# Patient Record
Sex: Male | Born: 1945 | Race: White | Hispanic: No | Marital: Married | Smoking: Former smoker
Health system: Southern US, Community
[De-identification: ages and names within clinical notes are randomized; demographics above are authoritative.]

## PROBLEM LIST (undated history)

## (undated) DIAGNOSIS — R9431 Abnormal electrocardiogram [ECG] [EKG]: Secondary | ICD-10-CM

## (undated) DIAGNOSIS — G629 Polyneuropathy, unspecified: Secondary | ICD-10-CM

## (undated) DIAGNOSIS — I10 Essential (primary) hypertension: Secondary | ICD-10-CM

## (undated) DIAGNOSIS — Z8719 Personal history of other diseases of the digestive system: Secondary | ICD-10-CM

## (undated) DIAGNOSIS — J45909 Unspecified asthma, uncomplicated: Secondary | ICD-10-CM

## (undated) DIAGNOSIS — K219 Gastro-esophageal reflux disease without esophagitis: Secondary | ICD-10-CM

## (undated) DIAGNOSIS — Z8711 Personal history of peptic ulcer disease: Secondary | ICD-10-CM

## (undated) DIAGNOSIS — E7800 Pure hypercholesterolemia, unspecified: Secondary | ICD-10-CM

## (undated) DIAGNOSIS — K529 Noninfective gastroenteritis and colitis, unspecified: Secondary | ICD-10-CM

## (undated) DIAGNOSIS — Z87442 Personal history of urinary calculi: Secondary | ICD-10-CM

## (undated) HISTORY — DX: Noninfective gastroenteritis and colitis, unspecified: K52.9

## (undated) HISTORY — DX: Gastro-esophageal reflux disease without esophagitis: K21.9

## (undated) HISTORY — PX: ERCP: SHX60

## (undated) HISTORY — DX: Personal history of peptic ulcer disease: Z87.11

## (undated) HISTORY — DX: Pure hypercholesterolemia, unspecified: E78.00

## (undated) HISTORY — DX: Essential (primary) hypertension: I10

## (undated) HISTORY — DX: Unspecified asthma, uncomplicated: J45.909

---
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
## 2007-03-30 DIAGNOSIS — C61 Malignant neoplasm of prostate: Secondary | ICD-10-CM

## 2007-03-30 HISTORY — DX: Malignant neoplasm of prostate: C61

## 2007-03-30 HISTORY — PX: PROSTATE SURGERY: SHX751

## 2011-03-30 HISTORY — PX: OTHER SURGICAL HISTORY: SHX169

## 2012-03-29 HISTORY — PX: OTHER SURGICAL HISTORY: SHX169

## 2012-07-13 DIAGNOSIS — W3400XD Accidental discharge from unspecified firearms or gun, subsequent encounter: Secondary | ICD-10-CM | POA: Insufficient documentation

## 2012-07-13 DIAGNOSIS — I872 Venous insufficiency (chronic) (peripheral): Secondary | ICD-10-CM | POA: Insufficient documentation

## 2012-07-13 DIAGNOSIS — L97309 Non-pressure chronic ulcer of unspecified ankle with unspecified severity: Secondary | ICD-10-CM | POA: Insufficient documentation

## 2012-07-28 DIAGNOSIS — M19079 Primary osteoarthritis, unspecified ankle and foot: Secondary | ICD-10-CM | POA: Insufficient documentation

## 2012-11-02 DIAGNOSIS — E7800 Pure hypercholesterolemia, unspecified: Secondary | ICD-10-CM | POA: Insufficient documentation

## 2012-11-02 DIAGNOSIS — I10 Essential (primary) hypertension: Secondary | ICD-10-CM | POA: Diagnosis present

## 2013-03-29 DIAGNOSIS — A0471 Enterocolitis due to Clostridium difficile, recurrent: Secondary | ICD-10-CM | POA: Insufficient documentation

## 2014-09-23 DIAGNOSIS — D649 Anemia, unspecified: Secondary | ICD-10-CM | POA: Insufficient documentation

## 2014-09-23 DIAGNOSIS — Z7960 Long term (current) use of unspecified immunomodulators and immunosuppressants: Secondary | ICD-10-CM | POA: Insufficient documentation

## 2016-03-29 HISTORY — PX: OTHER SURGICAL HISTORY: SHX169

## 2016-03-29 HISTORY — PX: UPPER GI ENDOSCOPY: SHX6162

## 2016-05-31 DIAGNOSIS — R7989 Other specified abnormal findings of blood chemistry: Secondary | ICD-10-CM | POA: Insufficient documentation

## 2016-05-31 DIAGNOSIS — R932 Abnormal findings on diagnostic imaging of liver and biliary tract: Secondary | ICD-10-CM | POA: Insufficient documentation

## 2016-07-13 DIAGNOSIS — R7989 Other specified abnormal findings of blood chemistry: Secondary | ICD-10-CM | POA: Insufficient documentation

## 2016-07-13 DIAGNOSIS — K838 Other specified diseases of biliary tract: Secondary | ICD-10-CM | POA: Insufficient documentation

## 2017-01-24 DIAGNOSIS — R197 Diarrhea, unspecified: Secondary | ICD-10-CM | POA: Insufficient documentation

## 2017-01-24 DIAGNOSIS — K529 Noninfective gastroenteritis and colitis, unspecified: Secondary | ICD-10-CM | POA: Insufficient documentation

## 2017-03-29 HISTORY — PX: OTHER SURGICAL HISTORY: SHX169

## 2017-08-27 HISTORY — PX: OTHER SURGICAL HISTORY: SHX169

## 2017-11-07 DIAGNOSIS — D500 Iron deficiency anemia secondary to blood loss (chronic): Secondary | ICD-10-CM | POA: Insufficient documentation

## 2017-11-30 DIAGNOSIS — K5100 Ulcerative (chronic) pancolitis without complications: Secondary | ICD-10-CM | POA: Diagnosis present

## 2019-04-03 ENCOUNTER — Ambulatory Visit (INDEPENDENT_AMBULATORY_CARE_PROVIDER_SITE_OTHER): Payer: Medicare Other | Admitting: Urology

## 2019-04-03 ENCOUNTER — Encounter: Payer: Self-pay | Admitting: Urology

## 2019-04-03 ENCOUNTER — Other Ambulatory Visit (HOSPITAL_COMMUNITY)
Admission: RE | Admit: 2019-04-03 | Discharge: 2019-04-03 | Disposition: A | Discharge status: Home / Self Care | Payer: Medicare Other | Source: Skilled Nursing Facility | Attending: Urology | Admitting: Urology

## 2019-04-03 ENCOUNTER — Other Ambulatory Visit: Payer: Self-pay

## 2019-04-03 VITALS — BP 131/87 | HR 78 | Temp 98.3°F | Ht 69.0 in | Wt 226.0 lb

## 2019-04-03 DIAGNOSIS — Z8546 Personal history of malignant neoplasm of prostate: Secondary | ICD-10-CM | POA: Insufficient documentation

## 2019-04-03 DIAGNOSIS — R339 Retention of urine, unspecified: Secondary | ICD-10-CM | POA: Diagnosis present

## 2019-04-03 DIAGNOSIS — N9989 Other postprocedural complications and disorders of genitourinary system: Secondary | ICD-10-CM | POA: Insufficient documentation

## 2019-04-03 DIAGNOSIS — N200 Calculus of kidney: Secondary | ICD-10-CM

## 2019-04-03 DIAGNOSIS — IMO0002 Reserved for concepts with insufficient information to code with codable children: Secondary | ICD-10-CM

## 2019-04-03 LAB — POCT URINALYSIS DIPSTICK
Bilirubin, UA: NEGATIVE
Glucose, UA: NEGATIVE
Ketones, UA: NEGATIVE
Nitrite, UA: NEGATIVE
Protein, UA: POSITIVE — AB
Spec Grav, UA: 1.025 (ref 1.010–1.025)
Urobilinogen, UA: NEGATIVE E.U./dL — AB
pH, UA: 5 (ref 5.0–8.0)

## 2019-04-03 MED ORDER — TAMSULOSIN HCL 0.4 MG PO CAPS: 0.4000 mg | ORAL_CAPSULE | Freq: Every day | ORAL | 3 refills | Status: DC

## 2019-04-03 NOTE — Progress Notes (Addendum)
H&P  Chief Complaint: Prostate Cancer  History of Present Illness:   1.5.2021: He presents today having had an acute episode of urinary retention. He has had this issue before -- his first episode was in 2013. At this time, he states he was unable to urinate beyond a few initial dribbles. He required a catheter. He does not, however, have any old records. This current episode began 2 wks prior -- he still remembered how to perform CIC and has been self-treating his retention w/ mail ordered sterile catheters. He is currently using CIC q 12-24 hrs -- he is able to freely void but only with small volumes/weak stream and does not feel like he is emptying well. With CIC, he is apparently draining around 500 mL. He tolerates using CIC very well and states that his largest complaint is some occasional leakage.   He has a hx of prostate cancer (apparently low volume, non-aggressive) -- this was treated with radiation seeds but he apparently required avodart to shrink his prostate prior to treatment. He was also apparently left with a catheter for several months following this procedure. He was later seen per another urologist who took him off the catheter and managed his bph sx's with follow-up TURP(around 2013). His cancer was diagnosed when his PSA was around 6 and was apparently very low following treatment -- he thinks his last PSA was drawn in 2013 but cannot recall. He has not been seen by a urologist since 2014. He apparently was on tamsulosin for 10 yrs prior to surgical intervention and did not have any improvement in sx's.   No past medical history on file.  Home Medications:  Allergies as of 04/03/2019      Reactions   Cefdinir Other (See Comments)   Other reaction(s): Other (See Comments) Cannot take - causes problems with colitis---- "Worsens Diarrhea" Cannot take - causes problems with colitis      Medication List       Accurate as of April 03, 2019  3:42 PM. If you have any questions,  ask your nurse or doctor.        atorvastatin 10 MG tablet Commonly known as: LIPITOR   Flovent HFA 110 MCG/ACT inhaler Generic drug: fluticasone Inhale into the lungs.   fluticasone 50 MCG/ACT nasal spray Commonly known as: FLONASE Place 2 sprays into each nostril as needed for rhinitis.   gabapentin 600 MG tablet Commonly known as: NEURONTIN   hyoscyamine 0.125 MG tablet Commonly known as: LEVSIN Take by mouth.   telmisartan 80 MG tablet Commonly known as: MICARDIS   Ventolin HFA 108 (90 Base) MCG/ACT inhaler Generic drug: albuterol 90 mcg/Actuation  prn as needed   verapamil 240 MG CR tablet Commonly known as: CALAN-SR Take by mouth.   Xeljanz 5 MG Tabs Generic drug: Tofacitinib Citrate Take 1 tablet by mouth twice a day as directed by physician.       Allergies:  Allergies  Allergen Reactions  . Cefdinir Other (See Comments)    Other reaction(s): Other (See Comments) Cannot take - causes problems with colitis---- "Worsens Diarrhea" Cannot take - causes problems with colitis     No family history on file.  Social History:  reports that he quit smoking about 6 years ago. His smoking use included cigarettes. He has a 40.00 pack-year smoking history. He has never used smokeless tobacco. No history on file for alcohol and drug.  ROS: A complete review of systems was performed.  All systems are negative except  for pertinent findings as noted.  Physical Exam: Vital signs in last 24 hours: BP 131/87   Pulse 78   Temp 98.3 F (36.8 C)   Ht 5\' 9"  (1.753 m)   Wt 226 lb (102.5 kg)   BMI 33.37 kg/m  Constitutional:  Alert and oriented, No acute distress Cardiovascular: Regular rate  Respiratory: Normal respiratory effort GI: Abdomen is soft, nontender, nondistended, no abdominal masses. No CVAT.  Genitourinary: Normal male phallus, testes are descended bilaterally and non-tender and without masses, right sided hydrocele, scrotum is normal in appearance  without lesions or masses, perineum is normal on inspection. Prostate feels smooth and flat. Lymphatic: No lymphadenopathy Neurologic: Grossly intact, no focal deficits Psychiatric: Normal mood and affect  Laboratory Data:  No results for input(s): WBC, HGB, HCT, PLT in the last 72 hours.  No results for input(s): NA, K, CL, GLUCOSE, BUN, CALCIUM, CREATININE in the last 72 hours.  Invalid input(s): CO3   Results for orders placed or performed in visit on 04/03/19 (from the past 24 hour(s))  POCT urinalysis dipstick     Status: Abnormal   Collection Time: 04/03/19  3:10 PM  Result Value Ref Range   Color, UA yellow    Clarity, UA clear    Glucose, UA Negative Negative   Bilirubin, UA neg    Ketones, UA neg    Spec Grav, UA 1.025 1.010 - 1.025   Blood, UA 3+    pH, UA 5.0 5.0 - 8.0   Protein, UA Positive (A) Negative   Urobilinogen, UA negative (A) 0.2 or 1.0 E.U./dL   Nitrite, UA neg    Leukocytes, UA Large (3+) (A) Negative   Appearance     Odor     No results found for this or any previous visit (from the past 240 hour(s)).  Renal Function: No results for input(s): CREATININE in the last 168 hours. CrCl cannot be calculated (No successful lab value found.).  Radiologic Imaging: No results found.  Impression/Assessment:  He has had no follow-up for his prostate cancer since 2014. His last PSA was drawn in 2013 -- but he recalls his PSA was around 6 when diagnosed with cancer and had decreased significantly/appropriately after treatment. We will try to get old records from him.   His leakage is likely overflow incontinence given he is not using CIC very often and when he does he is draining a very large volume of urine. It could be that he has some scar tissue regrowth that has become partially obstructive, but he will need to return for cysto to evaluate this.   Plan:  1. PSA today -- will forward results.   2. He will return in 4 wks for cysto to evaluate for  possible prostate regrowth or scarring.   3. He is fine to continue on CIC with 16 FR catheters.  4. Start on trial of tamsulosin until next OV -- if this improves sx's he is fine to remain on this long term.   5. Culture urine -- if needed, will call in an antibiotic for him.   6. He will obtain old GU records

## 2019-04-04 LAB — URINE CULTURE

## 2019-04-04 LAB — PSA: PSA: <0.1 ng/mL (ref <=4.0)

## 2019-04-06 ENCOUNTER — Telehealth: Payer: Self-pay

## 2019-04-06 NOTE — Telephone Encounter (Signed)
-----   Message from Franchot Gallo, MD sent at 04/05/2019  8:50 AM EST ----- Notify pt--psa 0

## 2019-04-06 NOTE — Progress Notes (Signed)
Responded to ED page. Assisted patient's son in consultation room A. Gathered son's contact information to pass on to RN. Gave patient's son a Patient Placement card with information. Provided spiritual care through [facility].   Rev. [place].

## 2019-04-06 NOTE — Telephone Encounter (Signed)
Pt notified and mychart email activation sent per patient request

## 2019-04-16 ENCOUNTER — Telehealth: Payer: Self-pay | Admitting: Urology

## 2019-04-16 NOTE — Telephone Encounter (Signed)
Pt called and said he recently saw Dr. Diona Fanti but his symptoms have since then worsened. He would like a call back regarding his cath and painful urination. Also would like to know if he can be seen sooner than 2/16 which is his next appt.

## 2019-04-17 NOTE — Telephone Encounter (Signed)
Reports pain after urination with increase of frequency. Pt still does cath every 6 hours. But feels like after cathing he is leaking more and burns. Any suggestions? Scheduled for 2/16 for cysto. Reports he is waking up during night with increase sensation to void but only dribbles until he caths.

## 2019-04-20 ENCOUNTER — Other Ambulatory Visit: Payer: Self-pay | Admitting: Urology

## 2019-04-20 DIAGNOSIS — C61 Malignant neoplasm of prostate: Secondary | ICD-10-CM

## 2019-04-20 MED ORDER — SULFAMETHOXAZOLE-TRIMETHOPRIM 800-160 MG PO TABS: 1.0000 | ORAL_TABLET | Freq: Two times a day (BID) | ORAL | 0 refills | Status: DC

## 2019-04-20 NOTE — Telephone Encounter (Signed)
Make sure he is emptying all of urine out w/ regular catheteriztion. I sent in abx

## 2019-04-24 ENCOUNTER — Ambulatory Visit: Payer: Medicare Other | Admitting: Urology

## 2019-04-24 ENCOUNTER — Encounter: Payer: Self-pay | Admitting: Urology

## 2019-04-24 ENCOUNTER — Other Ambulatory Visit: Payer: Self-pay

## 2019-04-24 ENCOUNTER — Ambulatory Visit (INDEPENDENT_AMBULATORY_CARE_PROVIDER_SITE_OTHER): Payer: Medicare Other | Admitting: Urology

## 2019-04-24 VITALS — BP 153/79 | HR 116 | Temp 97.0°F | Ht 69.0 in | Wt 232.0 lb

## 2019-04-24 DIAGNOSIS — N210 Calculus in bladder: Secondary | ICD-10-CM | POA: Diagnosis not present

## 2019-04-24 DIAGNOSIS — R339 Retention of urine, unspecified: Secondary | ICD-10-CM | POA: Diagnosis not present

## 2019-04-24 LAB — POCT URINALYSIS DIPSTICK
Bilirubin, UA: NEGATIVE
Glucose, UA: NEGATIVE
Ketones, UA: NEGATIVE
Nitrite, UA: NEGATIVE
Protein, UA: POSITIVE — AB
Spec Grav, UA: 1.025 (ref 1.010–1.025)
Urobilinogen, UA: NEGATIVE E.U./dL — AB
pH, UA: 5 (ref 5.0–8.0)

## 2019-04-24 MED ORDER — CIPROFLOXACIN HCL 500 MG PO TABS
500.0000 mg | ORAL_TABLET | Freq: Once | ORAL | Status: AC
  Administered 2019-04-24: 16:00:00 500 mg via ORAL

## 2019-04-24 MED ORDER — NITROFURANTOIN MONOHYD MACRO 100 MG PO CAPS: 100.0000 mg | ORAL_CAPSULE | Freq: Two times a day (BID) | ORAL | 0 refills | Status: AC

## 2019-04-24 NOTE — Addendum Note (Signed)
Addended byIris Pert on: 04/24/2019 04:27 PM   Modules accepted: Orders

## 2019-04-24 NOTE — Progress Notes (Signed)
H&P  Chief Complaint: Urinary Difficulties  History of Present Illness:   1.26.2021: He returns today c/o varied and bothersome urinary sx's over the last 3 weeks. He describes these sx's are a mixture of dysuria, frequency, urgency, hesitancy, and nocturia (there is apparently not much consistency to his sx's. He reports that he is, on average, catheterizing 3x a day but will apparently have "good days" where he can get by with just catheterizing once in the morning. His freely voided urinary volumes are much lower than his catheterized voids. He is apparently having less leakage, but this is difficult to discern as he is perseverating over his varied urinary symptomatology these last few weeks.   Past Medical History:  Diagnosis Date  . Asthma   . Colitis   . GERD (gastroesophageal reflux disease)   . High cholesterol   . History of stomach ulcers   . HTN (hypertension)   . Kidney stones   . Prostate cancer [facility])     Past Surgical History:  Procedure Laterality Date  . ERCP  07/29/2016  . PROSTATE SURGERY  2009   Seed implant    Home Medications:  Allergies as of 04/24/2019      Reactions   Cefdinir Other (See Comments)   Other reaction(s): Other (See Comments) Cannot take - causes problems with colitis---- "Worsens Diarrhea" Cannot take - causes problems with colitis      Medication List       Accurate as of April 24, 2019  2:32 PM. If you have any questions, ask your nurse or doctor.        atorvastatin 10 MG tablet Commonly known as: LIPITOR   calcium carbonate 1500 (600 Ca) MG Tabs tablet Commonly known as: OSCAL Take by mouth 2 (two) times daily with a meal.   denosumab 60 MG/ML Sosy injection Commonly known as: PROLIA Inject 60 mg into the skin every 6 (six) months.   Flovent HFA 110 MCG/ACT inhaler Generic drug: fluticasone Inhale into the lungs.   fluticasone 50 MCG/ACT nasal spray Commonly known as: FLONASE Place 2 sprays into each nostril as  needed for rhinitis.   gabapentin 600 MG tablet Commonly known as: NEURONTIN   hyoscyamine 0.125 MG tablet Commonly known as: LEVSIN Take by mouth.   loperamide 2 MG tablet Commonly known as: IMODIUM A-D Take 2 mg by mouth 4 (four) times daily as needed for diarrhea or loose stools.   sulfamethoxazole-trimethoprim 800-160 MG tablet Commonly known as: BACTRIM DS Take 1 tablet by mouth 2 (two) times daily.   tamsulosin 0.4 MG Caps capsule Commonly known as: FLOMAX Take 1 capsule (0.4 mg total) by mouth daily after supper.   telmisartan 80 MG tablet Commonly known as: MICARDIS   Ventolin HFA 108 (90 Base) MCG/ACT inhaler Generic drug: albuterol 90 mcg/Actuation  prn as needed   verapamil 240 MG CR tablet Commonly known as: CALAN-SR Take by mouth.   Xeljanz 5 MG Tabs Generic drug: Tofacitinib Citrate Take 1 tablet by mouth twice a day as directed by physician.       Allergies:  Allergies  Allergen Reactions  . Cefdinir Other (See Comments)    Other reaction(s): Other (See Comments) Cannot take - causes problems with colitis---- "Worsens Diarrhea" Cannot take - causes problems with colitis     Family History  Problem Relation Age of Onset  . Prostate cancer Other     Social History:  reports that he quit smoking about 6 years ago. His smoking use  included cigarettes. He has a 40.00 pack-year smoking history. He has never used smokeless tobacco. He reports current alcohol use. No history on file for drug.  ROS: A complete review of systems was performed.  All systems are negative except for pertinent findings as noted.  Physical Exam:  Vital signs in last 24 hours: BP (!) 153/79   Pulse (!) 116   Temp (!) 97 F (36.1 C)   Ht 5\' 9"  (1.753 m)   Wt 232 lb (105.2 kg)   BMI 34.26 kg/m  Constitutional:  Alert and oriented, No acute distress Cardiovascular: Regular rate  Respiratory: Normal respiratory effort GI: Abdomen is soft, nontender, nondistended, no  abdominal masses. No CVAT.  Genitourinary: Normal male phallus, testes are descended bilaterally and non-tender and without masses, scrotum is normal in appearance without lesions or masses, perineum is normal on inspection. Lymphatic: No lymphadenopathy Neurologic: Grossly intact, no focal deficits Psychiatric: Normal mood and affect  Laboratory Data:  No results for input(s): WBC, HGB, HCT, PLT in the last 72 hours.  No results for input(s): NA, K, CL, GLUCOSE, BUN, CALCIUM, CREATININE in the last 72 hours.  Invalid input(s): CO3   Results for orders placed or performed in visit on 04/24/19 (from the past 24 hour(s))  POCT urinalysis dipstick     Status: Abnormal   Collection Time: 04/24/19  2:01 PM  Result Value Ref Range   Color, UA yellow    Clarity, UA clear    Glucose, UA Negative Negative   Bilirubin, UA neg    Ketones, UA neg    Spec Grav, UA 1.025 1.010 - 1.025   Blood, UA ++    pH, UA 5.0 5.0 - 8.0   Protein, UA Positive (A) Negative   Urobilinogen, UA negative (A) 0.2 or 1.0 E.U./dL   Nitrite, UA neg    Leukocytes, UA Moderate (2+) (A) Negative   Appearance sediments    Odor     No results found for this or any previous visit (from the past 240 hour(s)).  Renal Function: No results for input(s): CREATININE in the last 168 hours. CrCl cannot be calculated (No successful lab value found.).  Radiologic Imaging: No results found.   I have reviewed prior pt notes  I have reviewed urinalysis results  I have reviewed prior PSA results  I have reviewed prior urine culture  Cystoscopy Procedure Note:  Indication: Urinary retention  After informed consent and discussion of the procedure and its risks, Sean Massey was positioned and prepped in the standard fashion. Cystoscopy was performed with a flexible cystoscope. The urethra, bladder neck and entire bladder was visualized in a standard fashion. The prostate was *nonobstructed. He had a 20 mm stone @ BN. The  ureteral orifices were visualized in their normal location and orientation. Urothelium was nml.     Impression/Assessment:  20 mm bladder stone  Plan:  Will arrange cystolithalopaxy. Risks/complications discussed  He needs to self cath w/ 16 fr disposable caths tid  Will send in abx to start 5 days before procedure

## 2019-04-27 ENCOUNTER — Other Ambulatory Visit: Payer: Self-pay | Admitting: Urology

## 2019-04-30 ENCOUNTER — Encounter (HOSPITAL_BASED_OUTPATIENT_CLINIC_OR_DEPARTMENT_OTHER): Payer: Self-pay | Admitting: Urology

## 2019-04-30 ENCOUNTER — Other Ambulatory Visit: Payer: Self-pay

## 2019-04-30 NOTE — Progress Notes (Addendum)
Spoke w/ via phone for pre-op interview---[facility] needs dos----I stat 8 , EKG             Lab results------requested lov dr Darral Dash and ekg with tracing done in last 6 months from [phone] phone number [facility] (patient sees for hypertension) NO RECORDS RECEIVED FROM [facility], EKG ORDERED FOR DAY OF SURGERY ADDENDUM : LOV NOTE DR Darral Dash 01-09-2019 CARDIOLOGY AND EKG DONE 01-09-2019 RECEIVED AND PLACED ON CHART. 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 Patient denies shortness of breath, chest pain, fever, cough a this phone interview.

## 2019-05-01 ENCOUNTER — Other Ambulatory Visit (HOSPITAL_COMMUNITY)
Admission: RE | Admit: 2019-05-01 | Discharge: 2019-05-01 | Disposition: A | Discharge status: Home / Self Care | Payer: Medicare Other | Source: Ambulatory Visit | Attending: Urology | Admitting: Urology

## 2019-05-01 DIAGNOSIS — Z20822 Contact with and (suspected) exposure to covid-19: Secondary | ICD-10-CM | POA: Insufficient documentation

## 2019-05-01 DIAGNOSIS — Z01812 Encounter for preprocedural laboratory examination: Secondary | ICD-10-CM | POA: Diagnosis present

## 2019-05-01 LAB — SARS CORONAVIRUS 2 (TAT 6-24 HRS): SARS Coronavirus 2: NEGATIVE

## 2019-05-03 ENCOUNTER — Encounter (HOSPITAL_BASED_OUTPATIENT_CLINIC_OR_DEPARTMENT_OTHER): Payer: Self-pay | Admitting: Urology

## 2019-05-04 ENCOUNTER — Encounter (HOSPITAL_BASED_OUTPATIENT_CLINIC_OR_DEPARTMENT_OTHER): Payer: Self-pay | Admitting: Urology

## 2019-05-04 ENCOUNTER — Ambulatory Visit (HOSPITAL_BASED_OUTPATIENT_CLINIC_OR_DEPARTMENT_OTHER): Payer: Medicare Other | Admitting: Anesthesiology

## 2019-05-04 ENCOUNTER — Ambulatory Visit (HOSPITAL_BASED_OUTPATIENT_CLINIC_OR_DEPARTMENT_OTHER)
Admission: RE | Admit: 2019-05-04 | Discharge: 2019-05-04 | Disposition: A | Discharge status: Home / Self Care | Payer: Medicare Other | Attending: Urology | Admitting: Urology

## 2019-05-04 ENCOUNTER — Encounter (HOSPITAL_BASED_OUTPATIENT_CLINIC_OR_DEPARTMENT_OTHER)
Admission: RE | Disposition: A | Discharge status: Home / Self Care | Payer: Self-pay | Source: Home / Self Care | Attending: Urology

## 2019-05-04 DIAGNOSIS — Z8546 Personal history of malignant neoplasm of prostate: Secondary | ICD-10-CM | POA: Diagnosis not present

## 2019-05-04 DIAGNOSIS — Z881 Allergy status to other antibiotic agents status: Secondary | ICD-10-CM | POA: Diagnosis not present

## 2019-05-04 DIAGNOSIS — E669 Obesity, unspecified: Secondary | ICD-10-CM | POA: Diagnosis not present

## 2019-05-04 DIAGNOSIS — Z8711 Personal history of peptic ulcer disease: Secondary | ICD-10-CM | POA: Insufficient documentation

## 2019-05-04 DIAGNOSIS — Z8042 Family history of malignant neoplasm of prostate: Secondary | ICD-10-CM | POA: Diagnosis not present

## 2019-05-04 DIAGNOSIS — Z6833 Body mass index (BMI) 33.0-33.9, adult: Secondary | ICD-10-CM | POA: Insufficient documentation

## 2019-05-04 DIAGNOSIS — E7800 Pure hypercholesterolemia, unspecified: Secondary | ICD-10-CM | POA: Diagnosis not present

## 2019-05-04 DIAGNOSIS — I10 Essential (primary) hypertension: Secondary | ICD-10-CM | POA: Diagnosis not present

## 2019-05-04 DIAGNOSIS — K219 Gastro-esophageal reflux disease without esophagitis: Secondary | ICD-10-CM | POA: Diagnosis not present

## 2019-05-04 DIAGNOSIS — J45909 Unspecified asthma, uncomplicated: Secondary | ICD-10-CM | POA: Insufficient documentation

## 2019-05-04 DIAGNOSIS — Z87891 Personal history of nicotine dependence: Secondary | ICD-10-CM | POA: Insufficient documentation

## 2019-05-04 DIAGNOSIS — Z888 Allergy status to other drugs, medicaments and biological substances status: Secondary | ICD-10-CM | POA: Diagnosis not present

## 2019-05-04 DIAGNOSIS — Z87442 Personal history of urinary calculi: Secondary | ICD-10-CM | POA: Insufficient documentation

## 2019-05-04 DIAGNOSIS — I452 Bifascicular block: Secondary | ICD-10-CM | POA: Insufficient documentation

## 2019-05-04 DIAGNOSIS — N210 Calculus in bladder: Secondary | ICD-10-CM | POA: Diagnosis not present

## 2019-05-04 HISTORY — DX: Personal history of urinary calculi: Z87.442

## 2019-05-04 HISTORY — DX: Abnormal electrocardiogram (ECG) (EKG): R94.31

## 2019-05-04 HISTORY — PX: HOLMIUM LASER APPLICATION: SHX5852

## 2019-05-04 HISTORY — PX: CYSTOSCOPY WITH LITHOLAPAXY: SHX1425

## 2019-05-04 LAB — POCT I-STAT, CHEM 8
BUN: 17 mg/dL (ref 8–23)
Calcium, Ion: 1.29 mmol/L (ref 1.15–1.40)
Chloride: 102 mmol/L (ref 98–111)
Creatinine, Ser: 1.4 mg/dL — ABNORMAL HIGH (ref 0.61–1.24)
Glucose, Bld: 109 mg/dL — ABNORMAL HIGH (ref 70–99)
HCT: 34 % — ABNORMAL LOW (ref 39.0–52.0)
Hemoglobin: 11.6 g/dL — ABNORMAL LOW (ref 13.0–17.0)
Potassium: 3.7 mmol/L (ref 3.5–5.1)
Sodium: 138 mmol/L (ref 135–145)
TCO2: 28 mmol/L (ref 22–32)

## 2019-05-04 SURGERY — CYSTOSCOPY, WITH BLADDER CALCULUS LITHOLAPAXY
Anesthesia: General | Site: Bladder

## 2019-05-04 MED ORDER — CEFAZOLIN SODIUM-DEXTROSE 2-3 GM-%(50ML) IV SOLR
INTRAVENOUS | Status: DC | PRN
  Administered 2019-05-04: 2 g via INTRAVENOUS

## 2019-05-04 MED ORDER — LIDOCAINE 2% (20 MG/ML) 5 ML SYRINGE
INTRAMUSCULAR | Status: DC | PRN
  Administered 2019-05-04: 80 mg via INTRAVENOUS

## 2019-05-04 MED ORDER — CEFAZOLIN SODIUM-DEXTROSE 2-4 GM/100ML-% IV SOLN
INTRAVENOUS | Status: AC
  Filled 2019-05-04: qty 100

## 2019-05-04 MED ORDER — FENTANYL CITRATE (PF) 100 MCG/2ML IJ SOLN
INTRAMUSCULAR | Status: DC | PRN
  Administered 2019-05-04 (×2): 25 ug via INTRAVENOUS
  Administered 2019-05-04: 50 ug via INTRAVENOUS

## 2019-05-04 MED ORDER — MEPERIDINE HCL 25 MG/ML IJ SOLN
6.2500 mg | INTRAMUSCULAR | Status: DC | PRN
  Filled 2019-05-04: qty 1

## 2019-05-04 MED ORDER — ONDANSETRON HCL 4 MG/2ML IJ SOLN
4.0000 mg | Freq: Once | INTRAMUSCULAR | Status: DC | PRN
  Filled 2019-05-04: qty 2

## 2019-05-04 MED ORDER — LIDOCAINE 2% (20 MG/ML) 5 ML SYRINGE
INTRAMUSCULAR | Status: AC
  Filled 2019-05-04: qty 5

## 2019-05-04 MED ORDER — DEXAMETHASONE SODIUM PHOSPHATE 10 MG/ML IJ SOLN
INTRAMUSCULAR | Status: DC | PRN
  Administered 2019-05-04: 10 mg via INTRAVENOUS

## 2019-05-04 MED ORDER — FENTANYL CITRATE (PF) 100 MCG/2ML IJ SOLN
INTRAMUSCULAR | Status: AC
  Filled 2019-05-04: qty 2

## 2019-05-04 MED ORDER — ONDANSETRON HCL 4 MG/2ML IJ SOLN
INTRAMUSCULAR | Status: DC | PRN
  Administered 2019-05-04: 4 mg via INTRAVENOUS

## 2019-05-04 MED ORDER — FENTANYL CITRATE (PF) 100 MCG/2ML IJ SOLN
25.0000 ug | INTRAMUSCULAR | Status: DC | PRN
  Filled 2019-05-04: qty 1

## 2019-05-04 MED ORDER — LACTATED RINGERS IV SOLN
INTRAVENOUS | Status: DC
  Filled 2019-05-04: qty 1000

## 2019-05-04 MED ORDER — PROPOFOL 10 MG/ML IV BOLUS
INTRAVENOUS | Status: DC | PRN
  Administered 2019-05-04: 160 mg via INTRAVENOUS

## 2019-05-04 MED ORDER — STERILE WATER FOR IRRIGATION IR SOLN
Status: DC | PRN
  Administered 2019-05-04: 6000 mL

## 2019-05-04 MED ORDER — ONDANSETRON HCL 4 MG/2ML IJ SOLN
INTRAMUSCULAR | Status: AC
  Filled 2019-05-04: qty 2

## 2019-05-04 MED ORDER — DEXAMETHASONE SODIUM PHOSPHATE 10 MG/ML IJ SOLN
INTRAMUSCULAR | Status: AC
  Filled 2019-05-04: qty 1

## 2019-05-04 MED ORDER — PROPOFOL 10 MG/ML IV BOLUS
INTRAVENOUS | Status: AC
  Filled 2019-05-04: qty 20

## 2019-05-04 SURGICAL SUPPLY — 18 items
BAG DRAIN URO-CYSTO SKYTR STRL (DRAIN) ×3 IMPLANT
CLOTH BEACON ORANGE TIMEOUT ST (SAFETY) ×3 IMPLANT
FIBER LASER FLEXIVA 1000 (UROLOGICAL SUPPLIES) IMPLANT
FIBER LASER FLEXIVA 200 (UROLOGICAL SUPPLIES) IMPLANT
FIBER LASER FLEXIVA 365 (UROLOGICAL SUPPLIES) IMPLANT
FIBER LASER FLEXIVA 550 (UROLOGICAL SUPPLIES) ×3 IMPLANT
GLOVE BIO SURGEON STRL SZ8 (GLOVE) ×9 IMPLANT
GOWN STRL REUS W/TWL XL LVL3 (GOWN DISPOSABLE) ×9 IMPLANT
IV NS IRRIG 3000ML ARTHROMATIC (IV SOLUTION) ×3 IMPLANT
KIT TURNOVER CYSTO (KITS) ×3 IMPLANT
MANIFOLD NEPTUNE II (INSTRUMENTS) ×3 IMPLANT
PACK CYSTO (CUSTOM PROCEDURE TRAY) ×3 IMPLANT
SYR TOOMEY IRRIG 70ML (MISCELLANEOUS) ×3
SYRINGE TOOMEY IRRIG 70ML (MISCELLANEOUS) ×1 IMPLANT
TUBE CONNECTING 12'X1/4 (SUCTIONS) ×1
TUBE CONNECTING 12X1/4 (SUCTIONS) ×2 IMPLANT
TUBING UROLOGY SET (TUBING) ×3 IMPLANT
WATER STERILE IRR 500ML POUR (IV SOLUTION) ×3 IMPLANT

## 2019-05-04 NOTE — Anesthesia Preprocedure Evaluation (Signed)
Anesthesia Evaluation  Patient identified by MRN, date of birth, ID band Patient awake    Reviewed: Allergy & Precautions, NPO status , Patient's Chart, lab work & pertinent test results  Airway Mallampati: II  TM Distance: >3 FB Neck ROM: Full    Dental  (+) Dental Advisory Given   Pulmonary neg pulmonary ROS, former smoker,    Pulmonary exam normal breath sounds clear to auscultation       Cardiovascular hypertension, Pt. on medications Normal cardiovascular exam Rhythm:Regular Rate:Normal     Neuro/Psych negative neurological ROS  negative psych ROS   GI/Hepatic Neg liver ROS, GERD  ,  Endo/Other  negative endocrine ROS  Renal/GU Renal disease     Musculoskeletal negative musculoskeletal ROS (+)   Abdominal (+) + obese,   Peds  Hematology negative hematology ROS (+)   Anesthesia Other Findings   Reproductive/Obstetrics                             Anesthesia Physical Anesthesia Plan  ASA: III  Anesthesia Plan: General   Post-op Pain Management:    Induction: Intravenous  PONV Risk Score and Plan: 4 or greater and Ondansetron, Dexamethasone and Treatment may vary due to age or medical condition  Airway Management Planned: LMA  Additional Equipment: None  Intra-op Plan:   Post-operative Plan: Extubation in OR  Informed Consent: I have reviewed the patients History and Physical, chart, labs and discussed the procedure including the risks, benefits and alternatives for the proposed anesthesia with the patient or authorized representative who has indicated his/her understanding and acceptance.     Dental advisory given  Plan Discussed with: CRNA  Anesthesia Plan Comments:         Anesthesia Quick Evaluation
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
## 2019-05-04 NOTE — Anesthesia Postprocedure Evaluation (Signed)
Anesthesia Post Note  Patient: Sean Massey  Procedure(s) Performed: CYSTOSCOPY WITH LITHOLAPAXY (N/A Bladder) HOLMIUM LASER APPLICATION (N/A Bladder)     Patient location during evaluation: PACU Anesthesia Type: General Level of consciousness: sedated and patient cooperative Pain management: pain level controlled Vital Signs Assessment: post-procedure vital signs reviewed and stable Respiratory status: spontaneous breathing Cardiovascular status: stable Anesthetic complications: no    Last Vitals:  Vitals:   05/04/19 1255 05/04/19 1315  BP: (!) 138/96 (!) 141/92  Pulse: 69 67  Resp: (!) 9 12  Temp: 36.6 C   SpO2: 97% 94%    Last Pain:  Vitals:   05/04/19 1330  TempSrc:   PainSc: 0-No pain                 Nolon Nations

## 2019-05-04 NOTE — Transfer of Care (Signed)
Immediate Anesthesia Transfer of Care Note  Patient: Sean Massey  Procedure(s) Performed: CYSTOSCOPY WITH LITHOLAPAXY (N/A Bladder) HOLMIUM LASER APPLICATION (N/A Bladder)  Patient Location: PACU  Anesthesia Type:General  Level of Consciousness: awake, alert  and oriented  Airway & Oxygen Therapy: Patient Spontanous Breathing and Patient connected to nasal cannula oxygen  Post-op Assessment: Report given to RN and Post -op Vital signs reviewed and stable  Post vital signs: Reviewed and stable  Last Vitals:  Vitals Value Taken Time  BP 138/96 05/04/19 1255  Temp    Pulse 69 05/04/19 1256  Resp 9 05/04/19 1256  SpO2 97 % 05/04/19 1256  Vitals shown include unvalidated device data.  Last Pain:  Vitals:   05/04/19 1027  TempSrc: Oral  PainSc: 0-No pain      Patients Stated Pain Goal: 4 (22/63/33 [id number])  Complications: No apparent anesthesia complications

## 2019-05-04 NOTE — Anesthesia Procedure Notes (Signed)
Procedure Name: LMA Insertion Date/Time: 05/04/2019 12:23 PM Performed by: Dyanne Yorks D, CRNA Pre-anesthesia Checklist: Patient identified, Emergency Drugs available, Suction available and Patient being monitored Patient Re-evaluated:Patient Re-evaluated prior to induction Oxygen Delivery Method: Circle system utilized Preoxygenation: Pre-oxygenation with 100% oxygen Induction Type: IV induction Ventilation: Mask ventilation without difficulty LMA: LMA inserted LMA Size: 4.0 Tube type: Oral Number of attempts: 1 Placement Confirmation: ETT inserted through vocal cords under direct vision,  positive ETCO2 and breath sounds checked- equal and bilateral Tube secured with: Tape

## 2019-05-04 NOTE — Discharge Instructions (Signed)
1. You may see some blood in the urine and may have some burning with urination for 48-72 hours. You also may notice that you have to urinate more frequently or urgently after your procedure which is normal.  2. You should call should you develop an inability urinate, fever > 101, persistent nausea and vomiting that prevents you from eating or drinking to stay hydrated.  3. If you have a stent, you will likely urinate more frequently and urgently until the stent is removed and you may experience some discomfort/pain in the lower abdomen and flank especially when urinating. You may take pain medication prescribed to you if needed for pain. You may also intermittently have blood in the urine until the stent is removed. 4. If you have a catheter, you will be taught how to take care of the catheter by the nursing staff prior to discharge from the hospital.  You may periodically feel a strong urge to void with the catheter in place.  This is a bladder spasm and most often can occur when having a bowel movement or moving around. It is typically self-limited and usually will stop after a few minutes.  You may use some Vaseline or Neosporin around the tip of the catheter to reduce friction at the tip of the penis. You may also see some blood in the urine.  A very small amount of blood can make the urine look quite red.  As long as the catheter is draining well, there usually is not a problem.  However, if the catheter is not draining well and is bloody, you should call the office [phone]) to notify us.  Post Anesthesia Home Care Instructions  Activity: Get plenty of rest for the remainder of the day. A responsible individual must stay with you for 24 hours following the procedure.  For the next 24 hours, DO NOT: -Drive a car -Paediatric nurse -Drink alcoholic beverages -Take any medication unless instructed by your physician -Make any legal decisions or sign important papers.  Meals: Start with  liquid foods such as gelatin or soup. Progress to regular foods as tolerated. Avoid greasy, spicy, heavy foods. If nausea and/or vomiting occur, drink only clear liquids until the nausea and/or vomiting subsides. Call your physician if vomiting continues.  Special Instructions/Symptoms: Your throat may feel dry or sore from the anesthesia or the breathing tube placed in your throat during surgery. If this causes discomfort, gargle with warm salt water. The discomfort should disappear within 24 hours.  If you had a scopolamine patch placed behind your ear for the management of post- operative nausea and/or vomiting:  1. The medication in the patch is effective for 72 hours, after which it should be removed.  Wrap patch in a tissue and discard in the trash. Wash hands thoroughly with soap and water. 2. You may remove the patch earlier than 72 hours if you experience unpleasant side effects which may include dry mouth, dizziness or visual disturbances. 3. Avoid touching the patch. Wash your hands with soap and water after contact with the patch.

## 2019-05-04 NOTE — Op Note (Signed)
Preoperative diagnosis: 20 mm bladder calculus  Postoperative diagnosis: Same  Principal procedure cystolitholapaxy with laser of 20 mm bladder calculus  Surgeon: Janautica Netzley  Anesthesia: General with LMA  Complications: None  Specimen: Stone fragments  Estimated blood loss: None  Drains: None  Indications: 74 year old male with recently discovered bladder calculus.  The patient has significant symptoms with voiding, necessitating intermittent catheterization at times.  Recent cystoscopy revealed a 20 mm bladder calculus.  He presents at this time for management of this.  He is aware of the risks and complication of the procedure decides to proceed.  Description of procedure: Patient was properly identified in the preoperative area.  He was taken to the operating room where general anesthetic was administered with the LMA.  He is placed in the dorsolithotomy position.  Genitalia and perineum were prepped and draped.  He received preoperative IV antibiotics.  Timeout was performed.  82 French panendoscope was advanced into the bladder.  Prostate displayed findings with prior TURP.  The fossa was open.  In the bladder, urothelium was normal.  Ureteral orifice ease normal in configuration location.  Large bladder stone seen.  66 m fiber was used to apply energy to the stone which was fragmented into multiple smaller fragments that were then easily either rinsed through the scope or irrigated with a Toomey syringe.  Fragments were saved for analysis.  All stone matter was carefully removed from the bladder.  The patient had no significant bladder injury.  About 200 cc of water was left in the bladder for voiding trial later on.  The cystoscope was removed.  No catheter was placed.  He was then awakened and taken to the PACU in stable condition.  He tolerated the procedure well.

## 2019-05-15 ENCOUNTER — Other Ambulatory Visit: Payer: Medicare Other | Admitting: Urology

## 2019-07-10 ENCOUNTER — Other Ambulatory Visit: Payer: Self-pay

## 2019-07-10 ENCOUNTER — Ambulatory Visit (INDEPENDENT_AMBULATORY_CARE_PROVIDER_SITE_OTHER): Payer: Medicare Other | Admitting: Urology

## 2019-07-10 ENCOUNTER — Encounter: Payer: Self-pay | Admitting: Urology

## 2019-07-10 VITALS — BP 132/87 | HR 69 | Temp 97.6°F | Ht 69.0 in | Wt 230.0 lb

## 2019-07-10 DIAGNOSIS — R339 Retention of urine, unspecified: Secondary | ICD-10-CM

## 2019-07-10 LAB — POCT URINALYSIS DIPSTICK
Blood, UA: NEGATIVE
Glucose, UA: NEGATIVE
Ketones, UA: NEGATIVE
Leukocytes, UA: NEGATIVE
Nitrite, UA: NEGATIVE
Protein, UA: NEGATIVE
Spec Grav, UA: 1.025 (ref 1.010–1.025)
Urobilinogen, UA: NEGATIVE E.U./dL — AB
pH, UA: 5 (ref 5.0–8.0)

## 2019-07-10 NOTE — Progress Notes (Signed)
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
## 2020-04-28 ENCOUNTER — Encounter (INDEPENDENT_AMBULATORY_CARE_PROVIDER_SITE_OTHER): Payer: Medicare Other | Admitting: Ophthalmology

## 2020-04-28 ENCOUNTER — Encounter (INDEPENDENT_AMBULATORY_CARE_PROVIDER_SITE_OTHER): Payer: Self-pay | Admitting: Ophthalmology

## 2020-04-28 ENCOUNTER — Ambulatory Visit (INDEPENDENT_AMBULATORY_CARE_PROVIDER_SITE_OTHER): Payer: Medicare Other | Admitting: Ophthalmology

## 2020-04-28 ENCOUNTER — Other Ambulatory Visit: Payer: Self-pay

## 2020-04-28 DIAGNOSIS — H35033 Hypertensive retinopathy, bilateral: Secondary | ICD-10-CM

## 2020-04-28 DIAGNOSIS — H3581 Retinal edema: Secondary | ICD-10-CM | POA: Diagnosis not present

## 2020-04-28 DIAGNOSIS — Z8669 Personal history of other diseases of the nervous system and sense organs: Secondary | ICD-10-CM

## 2020-04-28 DIAGNOSIS — I10 Essential (primary) hypertension: Secondary | ICD-10-CM

## 2020-04-28 DIAGNOSIS — H43813 Vitreous degeneration, bilateral: Secondary | ICD-10-CM

## 2020-04-28 DIAGNOSIS — Z961 Presence of intraocular lens: Secondary | ICD-10-CM
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
## 2020-06-09 ENCOUNTER — Encounter (INDEPENDENT_AMBULATORY_CARE_PROVIDER_SITE_OTHER): Payer: Medicare PPO | Admitting: Ophthalmology
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
## 2020-07-15 ENCOUNTER — Encounter: Payer: Self-pay | Admitting: Urology

## 2020-07-15 ENCOUNTER — Ambulatory Visit (INDEPENDENT_AMBULATORY_CARE_PROVIDER_SITE_OTHER): Payer: Medicare PPO | Admitting: Urology

## 2020-07-15 ENCOUNTER — Other Ambulatory Visit: Payer: Self-pay

## 2020-07-15 ENCOUNTER — Other Ambulatory Visit: Payer: Medicare Other | Admitting: Urology

## 2020-07-15 VITALS — BP 147/91 | HR 76 | Temp 98.7°F | Ht 69.0 in | Wt 232.0 lb

## 2020-07-15 DIAGNOSIS — Z8546 Personal history of malignant neoplasm of prostate: Secondary | ICD-10-CM | POA: Diagnosis not present

## 2020-07-15 DIAGNOSIS — N210 Calculus in bladder: Secondary | ICD-10-CM | POA: Diagnosis not present

## 2020-07-15 DIAGNOSIS — Z87448 Personal history of other diseases of urinary system: Secondary | ICD-10-CM

## 2020-07-15 LAB — URINALYSIS, ROUTINE W REFLEX MICROSCOPIC
Bilirubin, UA: NEGATIVE
Glucose, UA: NEGATIVE
Ketones, UA: NEGATIVE
Leukocytes,UA: NEGATIVE
Nitrite, UA: NEGATIVE
Protein,UA: NEGATIVE
RBC, UA: NEGATIVE
Specific Gravity, UA: 1.01 (ref 1.005–1.030)
Urobilinogen, Ur: 0.2 mg/dL (ref 0.2–1.0)
pH, UA: 5.5 (ref 5.0–7.5)

## 2020-07-15 NOTE — Progress Notes (Signed)
Urological Symptom Review  Patient is experiencing the following symptoms: Frequent urination Get up at night to urinate Leakage of urine   Review of Systems  Gastrointestinal (upper)  : Indigestion/heartburn  Gastrointestinal (lower) : Diarrhea  Constitutional : Fatigue  Skin: Negative for skin symptoms  Eyes: Negative for eye symptoms  Ear/Nose/Throat : Sinus problems  Hematologic/Lymphatic: Easy bruise/bleeding  Cardiovascular : Negative for cardiovascular symptoms  Respiratory : Shortness of breath  Endocrine: Negative for endocrine symptoms  Musculoskeletal: Negative for musculoskeletal symptoms  Neurological: Negative for neurological symptoms  Psychologic: Negative for psychiatric symptoms

## 2020-07-16 LAB — PSA: Prostate Specific Ag, Serum: <0.1 ng/mL (ref 0.0–4.0)

## 2020-07-17 NOTE — Progress Notes (Signed)
Sent via mychart

## 2021-05-13 ENCOUNTER — Observation Stay (HOSPITAL_COMMUNITY)
Admission: EM | Admit: 2021-05-13 | Discharge: 2021-05-14 | Disposition: A | Discharge status: Home / Self Care | Payer: Medicare PPO | Attending: Internal Medicine | Admitting: Internal Medicine

## 2021-05-13 ENCOUNTER — Emergency Department (HOSPITAL_COMMUNITY): Payer: Medicare PPO

## 2021-05-13 ENCOUNTER — Other Ambulatory Visit: Payer: Self-pay

## 2021-05-13 ENCOUNTER — Encounter (HOSPITAL_COMMUNITY): Payer: Self-pay

## 2021-05-13 DIAGNOSIS — K529 Noninfective gastroenteritis and colitis, unspecified: Secondary | ICD-10-CM | POA: Insufficient documentation

## 2021-05-13 DIAGNOSIS — R930 Abnormal findings on diagnostic imaging of skull and head, not elsewhere classified: Secondary | ICD-10-CM | POA: Insufficient documentation

## 2021-05-13 DIAGNOSIS — Z8546 Personal history of malignant neoplasm of prostate: Secondary | ICD-10-CM | POA: Insufficient documentation

## 2021-05-13 DIAGNOSIS — K047 Periapical abscess without sinus: Secondary | ICD-10-CM | POA: Diagnosis present

## 2021-05-13 DIAGNOSIS — H43819 Vitreous degeneration, unspecified eye: Secondary | ICD-10-CM | POA: Insufficient documentation

## 2021-05-13 DIAGNOSIS — J45909 Unspecified asthma, uncomplicated: Secondary | ICD-10-CM | POA: Diagnosis not present

## 2021-05-13 DIAGNOSIS — I959 Hypotension, unspecified: Secondary | ICD-10-CM

## 2021-05-13 DIAGNOSIS — I129 Hypertensive chronic kidney disease with stage 1 through stage 4 chronic kidney disease, or unspecified chronic kidney disease: Secondary | ICD-10-CM | POA: Insufficient documentation

## 2021-05-13 DIAGNOSIS — N179 Acute kidney failure, unspecified: Secondary | ICD-10-CM | POA: Diagnosis not present

## 2021-05-13 DIAGNOSIS — F419 Anxiety disorder, unspecified: Secondary | ICD-10-CM | POA: Insufficient documentation

## 2021-05-13 DIAGNOSIS — I10 Essential (primary) hypertension: Secondary | ICD-10-CM | POA: Diagnosis present

## 2021-05-13 DIAGNOSIS — Z7902 Long term (current) use of antithrombotics/antiplatelets: Secondary | ICD-10-CM | POA: Insufficient documentation

## 2021-05-13 DIAGNOSIS — C61 Malignant neoplasm of prostate: Secondary | ICD-10-CM | POA: Insufficient documentation

## 2021-05-13 DIAGNOSIS — K219 Gastro-esophageal reflux disease without esophagitis: Secondary | ICD-10-CM | POA: Diagnosis not present

## 2021-05-13 DIAGNOSIS — E785 Hyperlipidemia, unspecified: Secondary | ICD-10-CM | POA: Diagnosis present

## 2021-05-13 DIAGNOSIS — K5100 Ulcerative (chronic) pancolitis without complications: Secondary | ICD-10-CM | POA: Diagnosis not present

## 2021-05-13 DIAGNOSIS — N1832 Chronic kidney disease, stage 3b: Secondary | ICD-10-CM

## 2021-05-13 DIAGNOSIS — Z961 Presence of intraocular lens: Secondary | ICD-10-CM | POA: Insufficient documentation

## 2021-05-13 DIAGNOSIS — Z20822 Contact with and (suspected) exposure to covid-19: Secondary | ICD-10-CM | POA: Insufficient documentation

## 2021-05-13 DIAGNOSIS — H26491 Other secondary cataract, right eye: Secondary | ICD-10-CM | POA: Insufficient documentation

## 2021-05-13 DIAGNOSIS — Z9181 History of falling: Secondary | ICD-10-CM | POA: Insufficient documentation

## 2021-05-13 DIAGNOSIS — Z79899 Other long term (current) drug therapy: Secondary | ICD-10-CM | POA: Diagnosis not present

## 2021-05-13 DIAGNOSIS — E860 Dehydration: Secondary | ICD-10-CM | POA: Diagnosis not present

## 2021-05-13 DIAGNOSIS — Z0000 Encounter for general adult medical examination without abnormal findings: Secondary | ICD-10-CM | POA: Insufficient documentation

## 2021-05-13 DIAGNOSIS — I447 Left bundle-branch block, unspecified: Secondary | ICD-10-CM | POA: Insufficient documentation

## 2021-05-13 DIAGNOSIS — G629 Polyneuropathy, unspecified: Secondary | ICD-10-CM | POA: Insufficient documentation

## 2021-05-13 DIAGNOSIS — N183 Chronic kidney disease, stage 3 unspecified: Secondary | ICD-10-CM | POA: Diagnosis present

## 2021-05-13 DIAGNOSIS — R918 Other nonspecific abnormal finding of lung field: Secondary | ICD-10-CM | POA: Diagnosis present

## 2021-05-13 DIAGNOSIS — H26492 Other secondary cataract, left eye: Secondary | ICD-10-CM | POA: Insufficient documentation

## 2021-05-13 LAB — COMPREHENSIVE METABOLIC PANEL
ALT: 44 U/L (ref 0–44)
AST: 44 U/L — ABNORMAL HIGH (ref 15–41)
Albumin: 3.8 g/dL (ref 3.5–5.0)
Alkaline Phosphatase: 67 U/L (ref 38–126)
Anion gap: 10 (ref 5–15)
BUN: 21 mg/dL (ref 8–23)
CO2: 25 mmol/L (ref 22–32)
Calcium: 10.5 mg/dL — ABNORMAL HIGH (ref 8.9–10.3)
Chloride: 98 mmol/L (ref 98–111)
Creatinine, Ser: 2.29 mg/dL — ABNORMAL HIGH (ref 0.61–1.24)
GFR, Estimated: 29 mL/min — ABNORMAL LOW (ref >60)
Glucose, Bld: 133 mg/dL — ABNORMAL HIGH (ref 70–99)
Potassium: 4 mmol/L (ref 3.5–5.1)
Sodium: 133 mmol/L — ABNORMAL LOW (ref 135–145)
Total Bilirubin: 0.8 mg/dL (ref 0.3–1.2)
Total Protein: 7.7 g/dL (ref 6.5–8.1)

## 2021-05-13 LAB — CBC WITH DIFFERENTIAL/PLATELET
Abs Immature Granulocytes: 0.1 10*3/uL — ABNORMAL HIGH (ref 0.00–0.07)
Basophils Absolute: 0 10*3/uL (ref 0.0–0.1)
Basophils Relative: 0 %
Eosinophils Absolute: 0 10*3/uL (ref 0.0–0.5)
Eosinophils Relative: 0 %
HCT: 34.1 % — ABNORMAL LOW (ref 39.0–52.0)
Hemoglobin: 10.6 g/dL — ABNORMAL LOW (ref 13.0–17.0)
Immature Granulocytes: 1 %
Lymphocytes Relative: 4 %
Lymphs Abs: 0.4 10*3/uL — ABNORMAL LOW (ref 0.7–4.0)
MCH: 29 pg (ref 26.0–34.0)
MCHC: 31.1 g/dL (ref 30.0–36.0)
MCV: 93.4 fL (ref 80.0–100.0)
Monocytes Absolute: 1.1 10*3/uL — ABNORMAL HIGH (ref 0.1–1.0)
Monocytes Relative: 9 %
Neutro Abs: 10.4 10*3/uL — ABNORMAL HIGH (ref 1.7–7.7)
Neutrophils Relative %: 86 %
Platelets: 313 10*3/uL (ref 150–400)
RBC: 3.65 MIL/uL — ABNORMAL LOW (ref 4.22–5.81)
RDW: 15 % (ref 11.5–15.5)
WBC: 12.1 10*3/uL — ABNORMAL HIGH (ref 4.0–10.5)
nRBC: 0 % (ref 0.0–0.2)

## 2021-05-13 LAB — TROPONIN I (HIGH SENSITIVITY)
Troponin I (High Sensitivity): 12 ng/L (ref <18)
Troponin I (High Sensitivity): 9 ng/L (ref <18)

## 2021-05-13 LAB — C DIFFICILE QUICK SCREEN W PCR REFLEX
C Diff antigen: NEGATIVE
C Diff interpretation: NOT DETECTED
C Diff toxin: NEGATIVE

## 2021-05-13 LAB — RESP PANEL BY RT-PCR (FLU A&B, COVID) ARPGX2
Influenza A by PCR: NEGATIVE
Influenza B by PCR: NEGATIVE
SARS Coronavirus 2 by RT PCR: NEGATIVE

## 2021-05-13 LAB — LACTIC ACID, PLASMA
Lactic Acid, Venous: 1.2 mmol/L (ref 0.5–1.9)
Lactic Acid, Venous: 1.4 mmol/L (ref 0.5–1.9)

## 2021-05-13 LAB — PROTIME-INR
INR: 1.1 (ref 0.8–1.2)
Prothrombin Time: 14 seconds (ref 11.4–15.2)

## 2021-05-13 IMAGING — CT CT NECK W/O CM
4 of 5 series · 14 of 33 positions shown, 16 images · non-contrast
Comparison: None.

CLINICAL DATA: Epiglottitis or tonsillitis suspected

EXAM:
CT NECK WITHOUT CONTRAST
TECHNIQUE: Multidetector CT imaging of the neck was performed following the
standard protocol without intravenous contrast.
RADIATION DOSE REDUCTION: This exam was performed according to the
departmental dose-optimization program which includes automated
exposure control, adjustment of the mA and/or kV according to
patient size and/or use of iterative reconstruction technique.

[Series 3: axial neck · axial · 0.57mm/px · z∈[-162,-52]mm · 3 of 111 slices shown]
[im 28/111  bone]
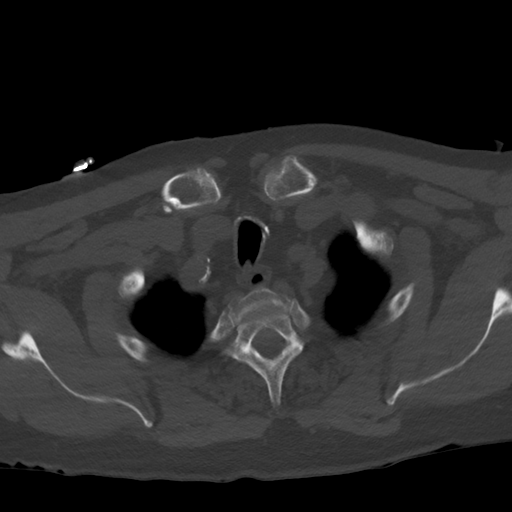
[im 56/111  bone]
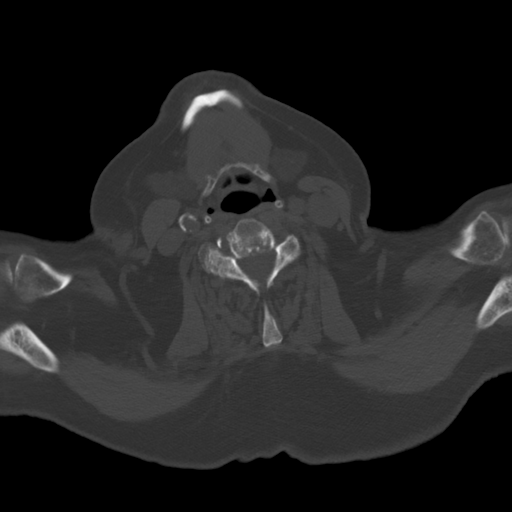
[im 83/111  bone]
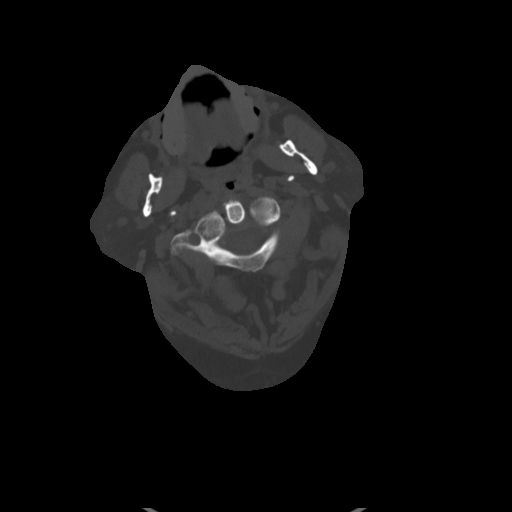

[Series 5: cor neck · coronal · 0.44mm/px · 3 of 129 slices shown]
[im 26/129  bone]
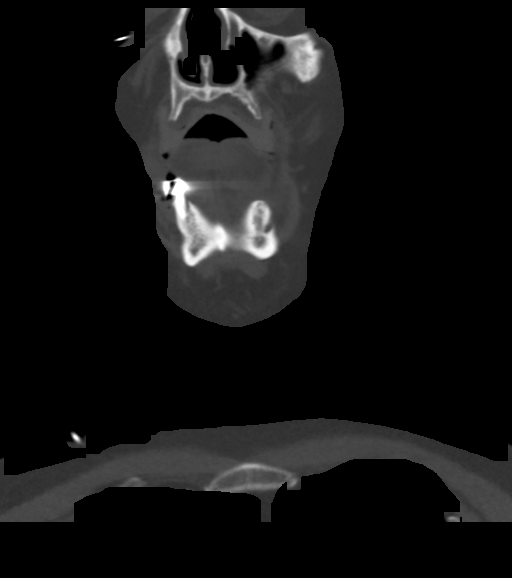
[im 52/129  bone]
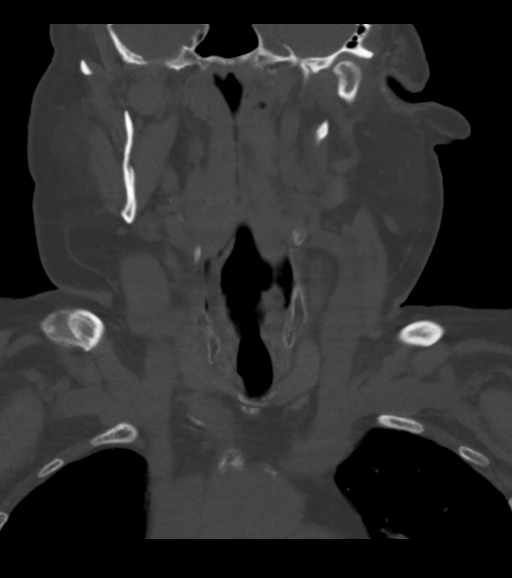
[im 77/129  bone]
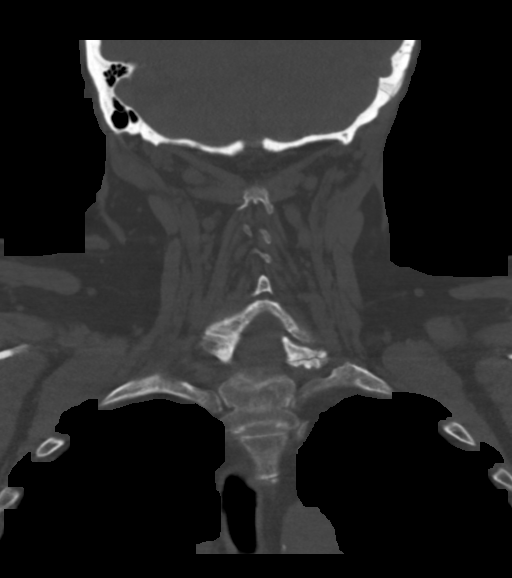

[Series 6: sag neck · sagittal · 0.47mm/px · 5 of 125 slices shown, 6 images]
[im 42/125  bone]
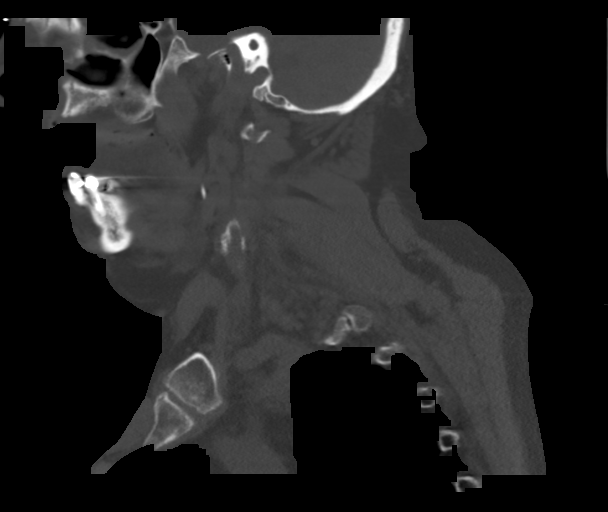
[im 52/125  bone]
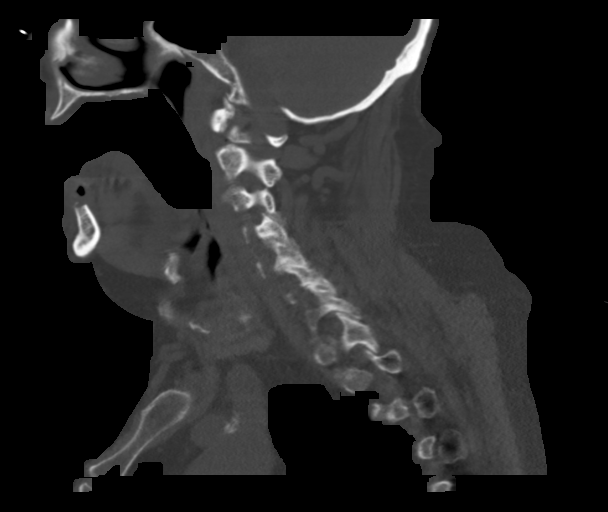
[im 63/125  soft-tissue]
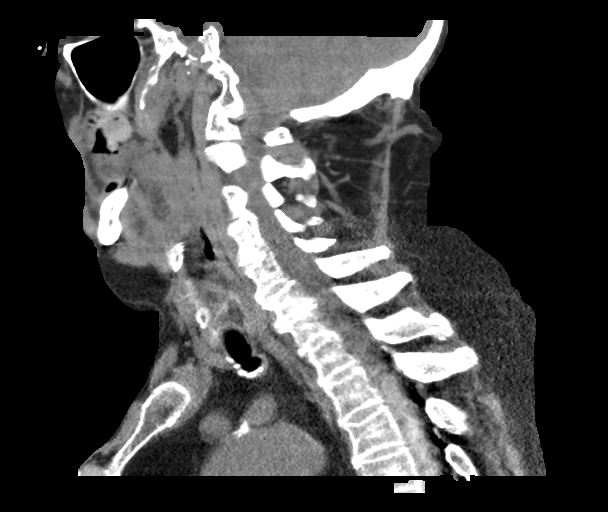
[im 63/125  bone]
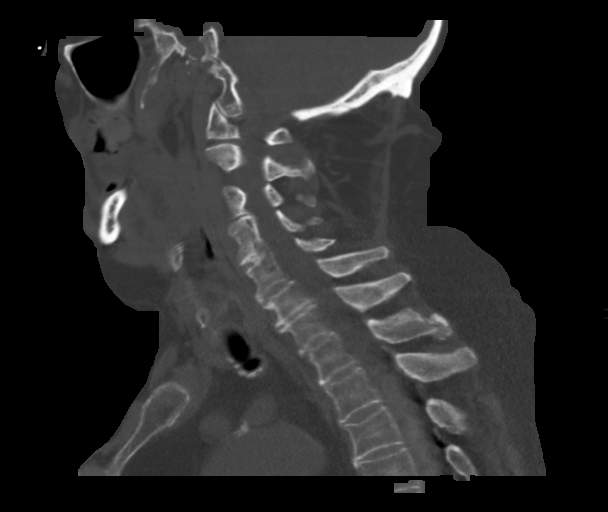
[im 73/125  bone]
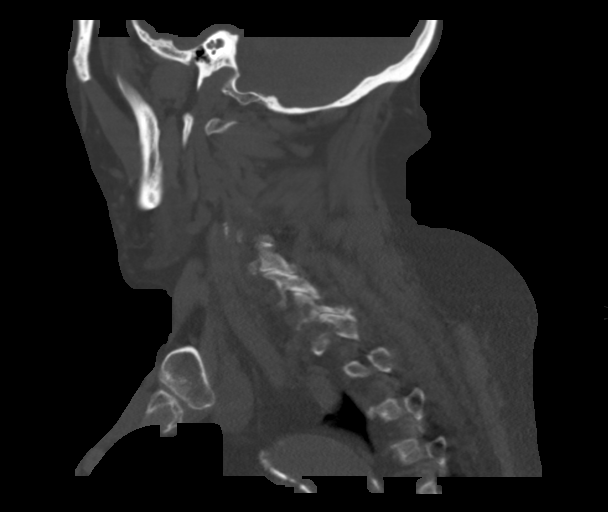
[im 83/125  bone]
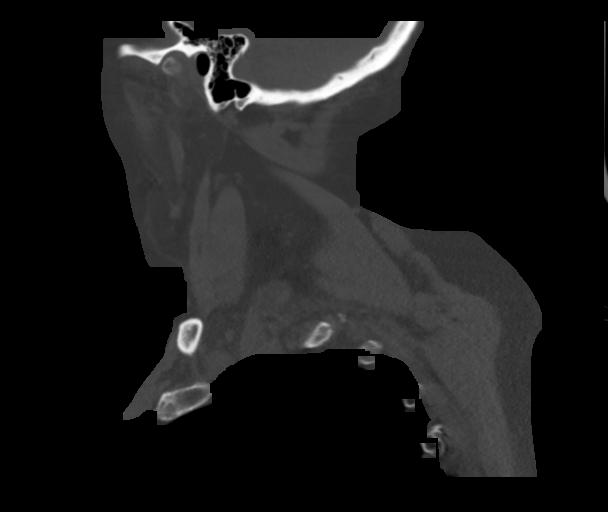

[Series 7: ax oropharynx (person_name) · axial · 0.39mm/px · z∈[-185,-59]mm · 3 of 130 slices shown, 4 images]
[im 33/130  soft-tissue]
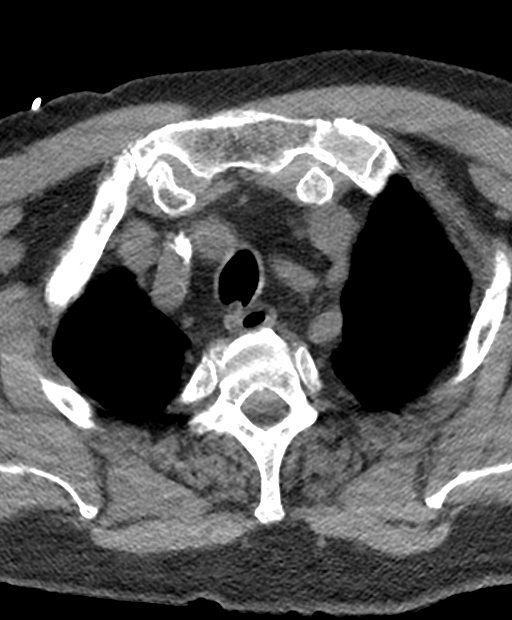
[im 33/130  bone]
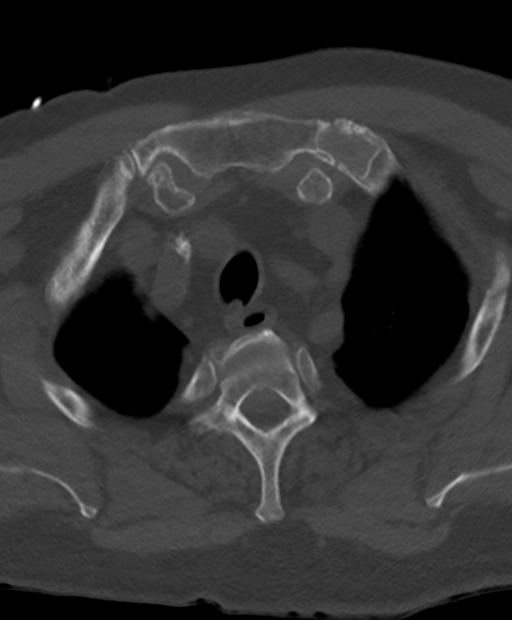
[im 65/130  bone]
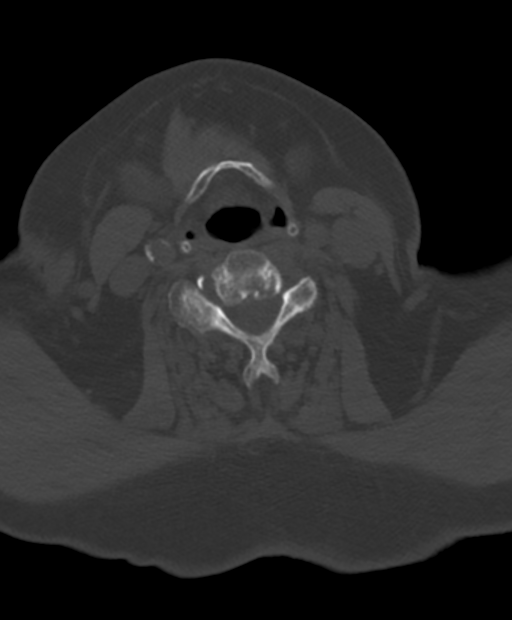
[im 97/130  bone]
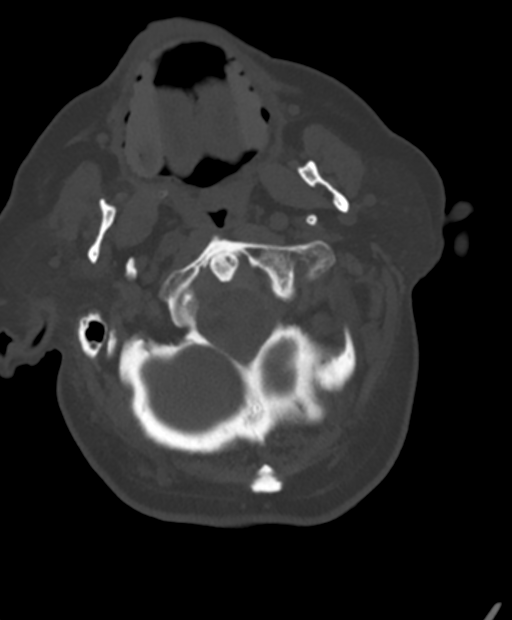

[14 of 33 positions shown; findings below may reference images not displayed]

FINDINGS: Pharynx and larynx: Normal. No mass or swelling.

Salivary glands: No inflammation, mass, or stone.

Thyroid: Normal.

Lymph nodes: None enlarged or abnormal density.

Vascular: Nondiagnostic evaluation without contrast.

Limited intracranial: Negative.

Visualized orbits: Negative.

Mastoids and visualized paranasal sinuses: Clear.

Skeleton: Multilevel degenerative change.

Upper chest: Spiculated 2.0 x 1.3 cm masslike consolidation in the
posterior aspect of the left upper lobe along the fissure.

Other: Multiple missing teeth along the anterior mandible with empty
tooth sockets and mild surrounding fat stranding. No discrete
drainable fluid collection identified. There is heterogeneous
sclerosis and lucency of the surrounding mandible.
IMPRESSION: 1. Spiculated 2.0 x 1.3 cm masslike consolidation in the posterior
aspect of the left upper lobe along the fissure. Given the patient's
clinical history, this could represent pneumonia, but bronchogenic
carcinoma is not excluded. Recommend short interval follow-up chest
CT after a trial of antibiotics to ensure resolution and exclude
malignancy.
2. Multiple missing teeth along the anterior mandible with empty
tooth sockets, surrounding lucency and sclerosis of the mandible,
and mild overlying soft tissue thickening/swelling. Given the
patient's history of recent tooth extractions and infection,
findings could represent infection and possibly osteomyelitis.
Recommend correlation with direct inspection. No discrete, drainable
fluid collection identified on this noncontrast study.

Findings and recommendations discussed with Dr. LOVE via
telephone at [DATE] p.m.

## 2021-05-13 IMAGING — DX DG CHEST 1V PORT
1 series · 1 of 1 positions shown · non-contrast
Comparison: None.

CLINICAL DATA: Chest discomfort, chills, nausea and vomiting

EXAM:
PORTABLE CHEST 1 VIEW

[chest ap]
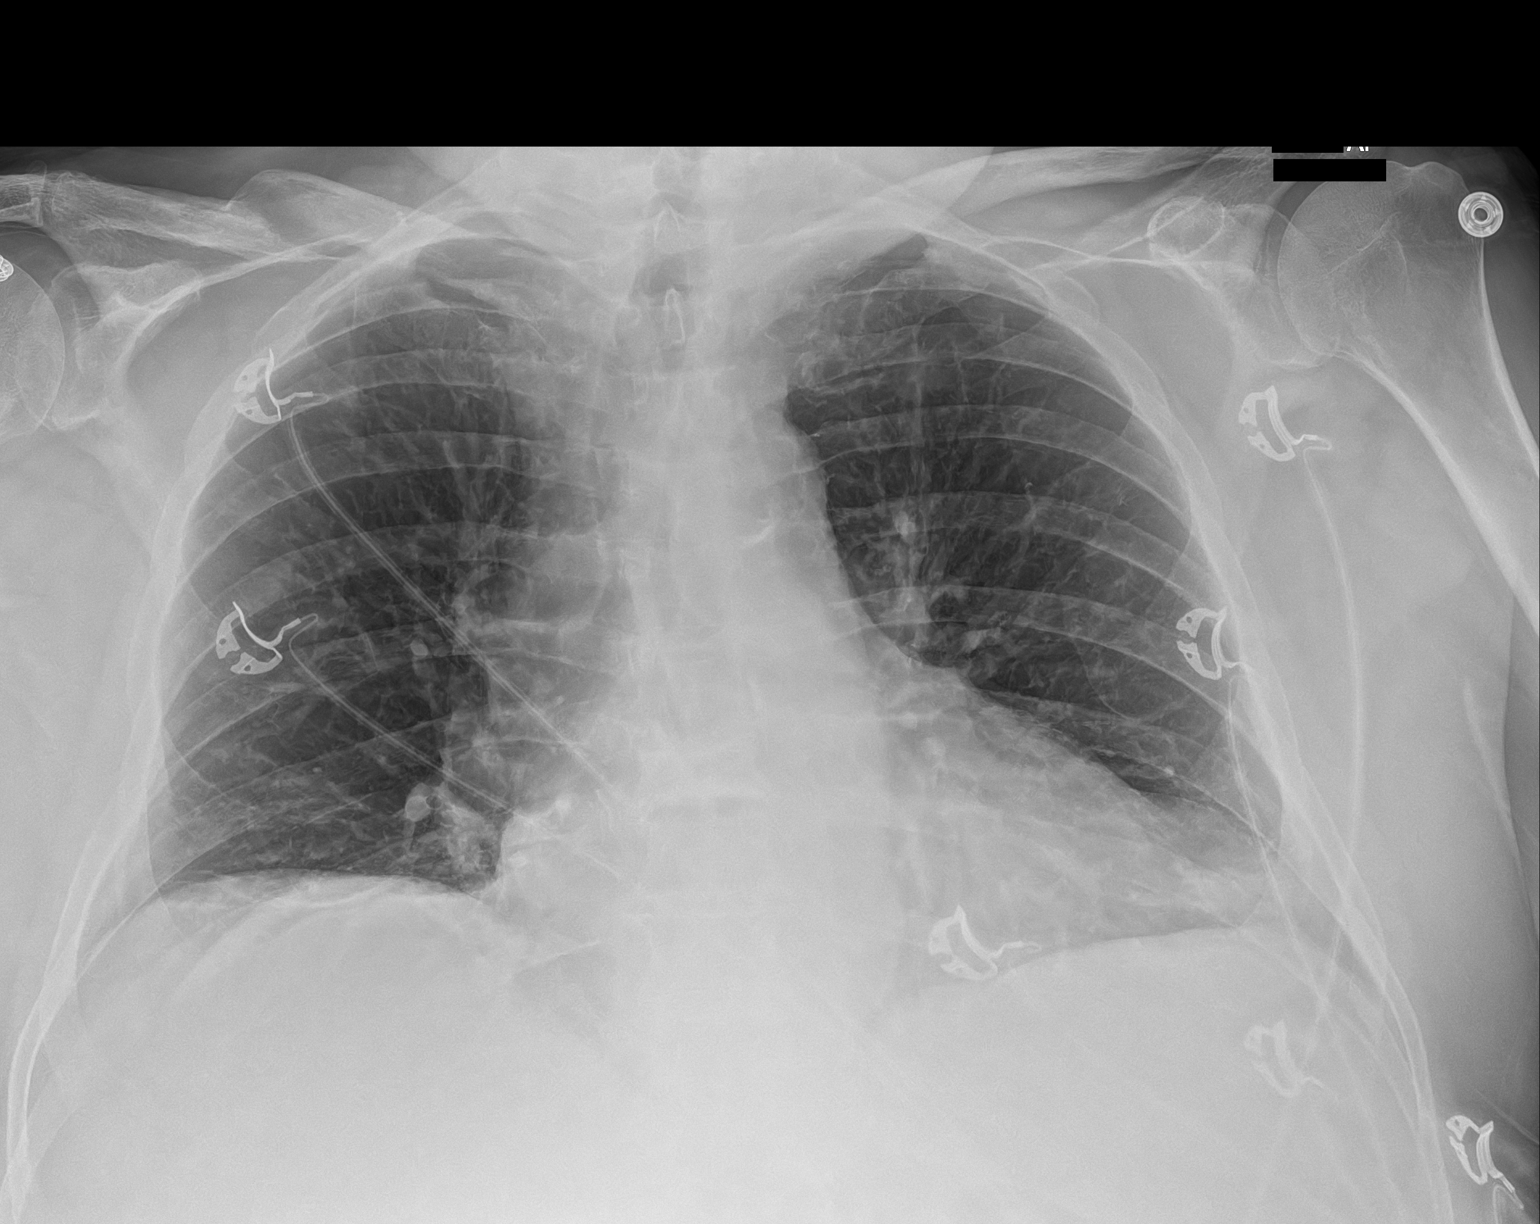

[1 of 1 positions shown; findings below may reference images not displayed]

FINDINGS: Transverse diameter of heart is increased. There is poor
inspiration. There are linear densities in the lower lung fields.
There is no pleural effusion or pneumothorax. There is deformity in
the right clavicle suggesting old fracture.
IMPRESSION: Cardiomegaly. There are no signs of pulmonary edema. There are
linear densities in both lower lung fields suggesting scarring or
subsegmental atelectasis.

## 2021-05-13 MED ORDER — SODIUM CHLORIDE 0.9 % IV SOLN: INTRAVENOUS | Status: DC

## 2021-05-13 MED ORDER — ENOXAPARIN SODIUM 40 MG/0.4ML IJ SOSY
40.0000 mg | PREFILLED_SYRINGE | Freq: Every day | INTRAMUSCULAR | Status: DC
  Administered 2021-05-13: 40 mg via SUBCUTANEOUS
  Filled 2021-05-13: qty 0.4

## 2021-05-13 MED ORDER — METRONIDAZOLE 500 MG/100ML IV SOLN
500.0000 mg | Freq: Two times a day (BID) | INTRAVENOUS | Status: DC
  Administered 2021-05-13 – 2021-05-14 (×3): 500 mg via INTRAVENOUS
  Filled 2021-05-13 (×3): qty 100

## 2021-05-13 MED ORDER — ONDANSETRON HCL 4 MG/2ML IJ SOLN: 4.0000 mg | Freq: Four times a day (QID) | INTRAMUSCULAR | Status: DC | PRN

## 2021-05-13 MED ORDER — GABAPENTIN 300 MG PO CAPS
600.0000 mg | ORAL_CAPSULE | Freq: Two times a day (BID) | ORAL | Status: DC
  Administered 2021-05-13 – 2021-05-14 (×2): 600 mg via ORAL
  Filled 2021-05-13 (×2): qty 2

## 2021-05-13 MED ORDER — ACETAMINOPHEN 650 MG RE SUPP: 650.0000 mg | Freq: Four times a day (QID) | RECTAL | Status: DC | PRN

## 2021-05-13 MED ORDER — LACTATED RINGERS IV BOLUS
1000.0000 mL | Freq: Once | INTRAVENOUS | Status: AC
  Administered 2021-05-13: 1000 mL via INTRAVENOUS

## 2021-05-13 MED ORDER — LACTATED RINGERS IV SOLN: INTRAVENOUS | Status: DC

## 2021-05-13 MED ORDER — ATORVASTATIN CALCIUM 10 MG PO TABS
10.0000 mg | ORAL_TABLET | Freq: Every day | ORAL | Status: DC
  Administered 2021-05-13 – 2021-05-14 (×2): 10 mg via ORAL
  Filled 2021-05-13 (×2): qty 1

## 2021-05-13 MED ORDER — LEVOFLOXACIN IN D5W 750 MG/150ML IV SOLN: 750.0000 mg | INTRAVENOUS | Status: DC

## 2021-05-13 MED ORDER — ASPIRIN EC 325 MG PO TBEC
325.0000 mg | DELAYED_RELEASE_TABLET | Freq: Every day | ORAL | Status: DC
  Administered 2021-05-14: 325 mg via ORAL
  Filled 2021-05-13: qty 1

## 2021-05-13 MED ORDER — TOFACITINIB CITRATE 5 MG PO TABS: 1.0000 | ORAL_TABLET | Freq: Two times a day (BID) | ORAL | Status: DC

## 2021-05-13 MED ORDER — GABAPENTIN 600 MG PO TABS
600.0000 mg | ORAL_TABLET | Freq: Two times a day (BID) | ORAL | Status: DC
  Filled 2021-05-13 (×4): qty 1

## 2021-05-13 MED ORDER — ACETAMINOPHEN 325 MG PO TABS: 650.0000 mg | ORAL_TABLET | Freq: Four times a day (QID) | ORAL | Status: DC | PRN

## 2021-05-13 MED ORDER — ONDANSETRON HCL 4 MG PO TABS: 4.0000 mg | ORAL_TABLET | Freq: Four times a day (QID) | ORAL | Status: DC | PRN

## 2021-05-13 MED ORDER — LEVOFLOXACIN IN D5W 750 MG/150ML IV SOLN
750.0000 mg | Freq: Once | INTRAVENOUS | Status: AC
  Administered 2021-05-13: 750 mg via INTRAVENOUS
  Filled 2021-05-13: qty 150

## 2021-05-13 MED ORDER — MORPHINE SULFATE (PF) 2 MG/ML IV SOLN: 2.0000 mg | INTRAVENOUS | Status: DC | PRN

## 2021-05-13 NOTE — Assessment & Plan Note (Addendum)
Related to poor p.o. intake, persistent diarrhea Resolved with IV fluids

## 2021-05-13 NOTE — ED Triage Notes (Signed)
NVD x 3 weeks since having dental procedure, pt has hx of ulcerative colitis. Weakness today.  Chest discomfort and chills yesterday.

## 2021-05-13 NOTE — Assessment & Plan Note (Addendum)
Patient has acute kidney injury on chronic kidney disease stage IIIb Baseline creatinine appears to be around 1.7 Admission creatinine 2.2 Likely related to dehydration Improved to 1.6 with hydration

## 2021-05-13 NOTE — Assessment & Plan Note (Signed)
Holding antihypertensives for now since he was hypotensive on arrival Resume as blood pressure tolerates

## 2021-05-13 NOTE — ED Triage Notes (Signed)
Pt was seen at PCP earlier today and was noted to have low bp. Has been on amoxicillin for dental abcess.

## 2021-05-13 NOTE — Assessment & Plan Note (Addendum)
Likely related to dehydration and poor p.o. intake Improved with hydration

## 2021-05-13 NOTE — Assessment & Plan Note (Signed)
Baseline creatinine approximately 1.7

## 2021-05-13 NOTE — Assessment & Plan Note (Addendum)
Incidental finding noted on CT neck Discussed with Dr. Delton Coombes and patient has been scheduled for outpatient evaluation on 3/1

## 2021-05-13 NOTE — ED Notes (Signed)
O2 sats noted to drop to 86% on room air while sleeping. Pt placed on 2 liters nasal cannula. Sats increased to 95% on 2L

## 2021-05-13 NOTE — Assessment & Plan Note (Signed)
Continue on statin

## 2021-05-13 NOTE — ED Provider Notes (Signed)
[facility] EMERGENCY DEPARTMENT Provider Note   CSN: [id number] Arrival date & time: 05/13/21  1132     History  Chief Complaint  Patient presents with   Hypotension    Sean Massey is a 76 y.o. male.  HPI 76 year old male presents with weakness and hypotension. 3 weeks ago had teeth removed and was found to have abscesses. Was previously on clindamycin, then put on amoxicillin after surgery.  After this he started developing vomiting whenever he tried to take an fluids.  He is also had acute on chronic diarrhea.  He has ulcerative colitis so he always has diarrhea but is worse.  No blood.  He had a low-grade temperature last week but no fevers now.  Feels diffusely weak and a few days ago he collapsed onto the ground but did not pass out or injure himself due to weakness tried to get out of a chair.  Went to his PCP today and his blood pressure was in the 70s so they sent him here.  He has been intermittently taking his blood pressure meds, most recently this morning. Had some chest pain last night. No abdominal pain.  Home Medications Prior to Admission medications   Medication Sig Start Date End Date Taking? Authorizing Provider  ALPRAZolam Duanne Moron) 0.5 MG tablet Take 0.5 mg by mouth as needed. 04/08/20   [provider]  atorvastatin (LIPITOR) 10 MG tablet at bedtime.  03/05/19   [provider]  calcium carbonate (OSCAL) 1500 (600 Ca) MG TABS tablet Take by mouth 2 (two) times daily with a meal.    [provider]  Denosumab (PROLIA [place]) Inject into the skin. Patient not taking: Reported on 07/15/2020 01/08/19   [provider]  denosumab (PROLIA) 60 MG/ML SOSY injection Inject 60 mg into the skin every 6 (six) months. Patient not taking: Reported on 07/15/2020    [provider]  denosumab (PROLIA) 60 MG/ML SOSY injection Inject into the skin. Patient not taking: Reported on 07/15/2020    [provider]  fluticasone (FLONASE) 50  MCG/ACT nasal spray Place 2 sprays into each nostril as needed for rhinitis. Patient not taking: Reported on 07/15/2020    [provider]  fluticasone (FLOVENT HFA) 110 MCG/ACT inhaler Inhale into the lungs. Patient not taking: Reported on 07/15/2020 12/17/12   [provider]  gabapentin (NEURONTIN) 600 MG tablet 2 (two) times daily.  03/05/19   [provider]  gabapentin (NEURONTIN) 600 MG tablet Take by mouth. 10/26/18   [provider]  hyoscyamine (LEVSIN SL) 0.125 MG SL tablet Take by mouth every 4 (four) hours as needed. 03/28/19   [provider]  hyoscyamine (LEVSIN) 0.125 MG tablet Take by mouth as needed.     [provider]  hyoscyamine (LEVSIN) 0.125 MG tablet Take by mouth. 02/23/17   [provider]  loperamide (IMODIUM A-D) 2 MG tablet Take 2 mg by mouth 4 (four) times daily as needed for diarrhea or loose stools.    [provider]  loperamide (IMODIUM) 2 MG capsule 2 mg  prn as needed 04/20/10   [provider]  pantoprazole (PROTONIX) 40 MG tablet  04/26/19   [provider]  Pantoprazole Sodium (PROTONIX PO) Take 40 mg by mouth 2 (two) times daily.    [provider]  tamsulosin (FLOMAX) 0.4 MG CAPS capsule Take 1 capsule (0.4 mg total) by mouth daily after supper. Patient not taking: No sig reported 04/03/19   Franchot Gallo,  MD  telmisartan (MICARDIS) 80 MG tablet at bedtime.  02/17/19   [provider]  Tofacitinib Citrate (XELJANZ) 5 MG TABS Take 1 tablet by mouth twice a day as directed by physician. 10/30/18   [provider]  Tofacitinib Citrate (XELJANZ) 5 MG TABS Take by mouth. 04/04/18   [provider]  Tofacitinib Citrate 10 MG TABS Take by mouth. 05/10/19 08/08/19  [provider]  Tofacitinib Citrate 5 MG TABS Take by mouth. Patient not taking: Reported on 07/15/2020 04/04/18   [provider]  verapamil (CALAN-SR) 240 MG CR tablet  Take by mouth.    [provider]      Allergies    Cefdinir and No known allergies    Review of Systems   Review of Systems  Constitutional:  Positive for fever.  HENT:  Positive for dental problem.   Respiratory:  Negative for cough and shortness of breath.   Gastrointestinal:  Positive for diarrhea and vomiting. Negative for abdominal pain.  Genitourinary:  Negative for dysuria.  Neurological:  Positive for weakness. Negative for headaches.   Physical Exam Updated Vital Signs BP 94/63    Pulse (!) 51    Temp 98.2 F (36.8 C) (Oral)    Resp 13    Ht 5\' 9"  (1.753 m)    Wt 105 kg    SpO2 90%    BMI 34.18 kg/m  Physical Exam Vitals and nursing note reviewed.  Constitutional:      Appearance: He is well-developed. He is not diaphoretic.  HENT:     Head: Normocephalic and atraumatic.     Mouth/Throat:   Cardiovascular:     Rate and Rhythm: Normal rate and regular rhythm.     Heart sounds: Normal heart sounds.  Pulmonary:     Effort: Pulmonary effort is normal.     Breath sounds: Normal breath sounds. No wheezing, rhonchi or rales.  Abdominal:     General: There is no distension.     Palpations: Abdomen is soft.     Tenderness: There is no abdominal tenderness.  Skin:    General: Skin is warm and dry.  Neurological:     Mental Status: He is alert and oriented to person, place, and time.     Comments: Alert and oriented to person, place, and time.  Equal strength in all 4 extremities though some weakness appreciated in the lower extremities symmetrically.  No slurred speech.    ED Results / Procedures / Treatments   Labs (all labs ordered are listed, but only abnormal results are displayed) Labs Reviewed  COMPREHENSIVE METABOLIC PANEL - Abnormal; Notable for the following components:      Result Value   Sodium 133 (*)    Glucose, Bld 133 (*)    Creatinine, Ser 2.29 (*)    Calcium 10.5 (*)    AST 44 (*)    GFR, Estimated 29 (*)    All other components  within normal limits  CBC WITH DIFFERENTIAL/PLATELET - Abnormal; Notable for the following components:   WBC 12.1 (*)    RBC 3.65 (*)    Hemoglobin 10.6 (*)    HCT 34.1 (*)    Neutro Abs 10.4 (*)    Lymphs Abs 0.4 (*)    Monocytes Absolute 1.1 (*)    Abs Immature Granulocytes 0.10 (*)    All other components within normal limits  CULTURE, BLOOD (ROUTINE X 2)  CULTURE, BLOOD (ROUTINE X 2)  RESP PANEL BY RT-PCR (FLU  A&B, COVID) [id number]  LACTIC ACID, PLASMA  LACTIC ACID, PLASMA  PROTIME-INR  URINALYSIS, ROUTINE W REFLEX MICROSCOPIC  TROPONIN I (HIGH SENSITIVITY)  TROPONIN I (HIGH SENSITIVITY)    EKG EKG Interpretation  Date/Time:  Wednesday May 13 2021 13:57:13 EST Ventricular Rate:  59 PR Interval:  204 QRS Duration: 130 QT Interval:  424 QTC Calculation: 420 R Axis:   -69 Text Interpretation: Sinus rhythm Nonspecific IVCD with LAD Abnormal T, consider ischemia, diffuse leads Confirmed by Sherwood Gambler [phone]) on 05/13/2021 2:50:35 PM  Radiology CT Soft Tissue Neck Wo Contrast  Result Date: 05/13/2021 CLINICAL DATA:  Epiglottitis or tonsillitis suspected EXAM: CT NECK WITHOUT CONTRAST TECHNIQUE: Multidetector CT imaging of the neck was performed following the standard protocol without intravenous contrast. RADIATION DOSE REDUCTION: This exam was performed according to the departmental dose-optimization program which includes automated exposure control, adjustment of the mA and/or kV according to patient size and/or use of iterative reconstruction technique. COMPARISON:  None. FINDINGS: Pharynx and larynx: Normal. No mass or swelling. Salivary glands: No inflammation, mass, or stone. Thyroid: Normal. Lymph nodes: None enlarged or abnormal density. Vascular: Nondiagnostic evaluation without contrast. Limited intracranial: Negative. Visualized orbits: Negative. Mastoids and visualized paranasal sinuses: Clear. Skeleton: Multilevel degenerative change. Upper chest: Spiculated 2.0  x 1.3 cm masslike consolidation in the posterior aspect of the left upper lobe along the fissure. Other: Multiple missing teeth along the anterior mandible with empty tooth sockets and mild surrounding fat stranding. No discrete drainable fluid collection identified. There is heterogeneous sclerosis and lucency of the surrounding mandible. IMPRESSION: 1. Spiculated 2.0 x 1.3 cm masslike consolidation in the posterior aspect of the left upper lobe along the fissure. Given the patient's clinical history, this could represent pneumonia, but bronchogenic carcinoma is not excluded. Recommend short interval follow-up chest CT after a trial of antibiotics to ensure resolution and exclude malignancy. 2. Multiple missing teeth along the anterior mandible with empty tooth sockets, surrounding lucency and sclerosis of the mandible, and mild overlying soft tissue thickening/swelling. Given the patient's history of recent tooth extractions and infection, findings could represent infection and possibly osteomyelitis. Recommend correlation with direct inspection. No discrete, drainable fluid collection identified on this noncontrast study. Findings and recommendations discussed with Dr. Regenia Skeeter via telephone at 2:30 p.m. Electronically Signed   By: Margaretha Sheffield M.D.   On: 05/13/2021 14:42   DG Chest Port 1 View  Result Date: 05/13/2021 CLINICAL DATA:  Chest discomfort, chills, nausea and vomiting EXAM: PORTABLE CHEST 1 VIEW COMPARISON:  None. FINDINGS: Transverse diameter of heart is increased. There is poor inspiration. There are linear densities in the lower lung fields. There is no pleural effusion or pneumothorax. There is deformity in the right clavicle suggesting old fracture. IMPRESSION: Cardiomegaly. There are no signs of pulmonary edema. There are linear densities in both lower lung fields suggesting scarring or subsegmental atelectasis. Electronically Signed   By: Elmer Picker M.D.   On: 05/13/2021 13:13     Procedures .Critical Care Performed by: Sherwood Gambler, MD Authorized by: Sherwood Gambler, MD   Critical care provider statement:    Critical care time (minutes):  40   Critical care time was exclusive of:  Separately billable procedures and treating other patients   Critical care was necessary to treat or prevent imminent or life-threatening deterioration of the following conditions:  Shock   Critical care was time spent personally by me on the following activities:  Development of treatment plan with patient or surrogate, discussions with consultants,  evaluation of patient's response to treatment, examination of patient, ordering and review of laboratory studies, ordering and review of radiographic studies, ordering and performing treatments and interventions, pulse oximetry, re-evaluation of patient's condition and review of old charts    Medications Ordered in ED Medications  metroNIDAZOLE (FLAGYL) IVPB 500 mg (500 mg Intravenous New Bag/Given 05/13/21 1524)  levofloxacin (LEVAQUIN) IVPB 750 mg (750 mg Intravenous New Bag/Given 05/13/21 1520)  lactated ringers bolus 1,000 mL (1,000 mLs Intravenous Bolus 05/13/21 1320)  lactated ringers bolus 1,000 mL (1,000 mLs Intravenous New Bag/Given 05/13/21 1409)    ED Course/ Medical Decision Making/ A&P                           Medical Decision Making Amount and/or Complexity of Data Reviewed Labs: ordered. Radiology: ordered.  Risk Prescription drug management. Decision regarding hospitalization.   Patient has hypotension, as low as in the 70s while I was examining him.  Thus he was started on IV fluids and at this point has been receiving 2 L IV fluids.  Labs reviewed/interpreted and he has an acute kidney injury, especially when compared to his baseline creatinine of 1.4 from a couple years ago.  He did have a more recent creatinine of 1.7 and care everywhere last fall.  He appears dehydrated and that is supported also by his history  of recent vomiting and diarrhea or at least increased diarrhea.  Has a benign abdominal exam.  No focal weakness and he is not altered.  He is not febrile here and was last febrile last week according to wife.  Given concern for underlying infection, chest x-ray was obtained.  I personally reviewed these images and there is no obvious pneumonia on my read.  ECG shows nonspecific findings and I doubt ACS given last chest pain was last night.  Initial troponin is normal.  CT scan was discussed with radiology and it appears that he has either mass versus pneumonia and will need repeat CT scan imaging after antibiotics.  He also appears to have residual dental infection though no abscess.  Potentially he has osteomyelitis.  Thus we will give antibiotics.  After reviewing up-to-date for odontogenic soft tissue infections and considering his recent Augmentin use as well as cephalosporin allergy, I think it would be best to put him on metronidazole and levofloxacin for now.  He will likely need continued fluids.  At this point, I think he is stable for a hospitalist admission.  I have consulted with Dr. Roderic Palau         Final Clinical Impression(s) / ED Diagnoses Final diagnoses:  Acute kidney injury [facility])    Rx / DC Orders ED Discharge Orders     None         Sherwood Gambler, MD 05/13/21 [phone]

## 2021-05-13 NOTE — Assessment & Plan Note (Addendum)
He is completed a course of clindamycin and amoxicillin prior to admission Currently on levofloxacin and Flagyl MRI neg for osteomyelitis of mandible, no abscess Clinically he is feeling better with antibiotics Follow up with primary dentist

## 2021-05-13 NOTE — Assessment & Plan Note (Addendum)
Discussed with Dr. Laural Golden and will hold Morrie Sheldon while patient is on antibiotics He will need to follow up with primary GI at [facility] Stool C diff negative Diarrhea improving with imodium on discharge
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
## 2021-05-14 ENCOUNTER — Observation Stay (HOSPITAL_COMMUNITY): Payer: Medicare PPO

## 2021-05-14 ENCOUNTER — Other Ambulatory Visit (HOSPITAL_COMMUNITY): Payer: Self-pay

## 2021-05-14 DIAGNOSIS — N1832 Chronic kidney disease, stage 3b: Secondary | ICD-10-CM | POA: Diagnosis not present

## 2021-05-14 DIAGNOSIS — K047 Periapical abscess without sinus: Secondary | ICD-10-CM | POA: Diagnosis not present

## 2021-05-14 DIAGNOSIS — N179 Acute kidney failure, unspecified: Secondary | ICD-10-CM | POA: Diagnosis not present

## 2021-05-14 DIAGNOSIS — C3490 Malignant neoplasm of unspecified part of unspecified bronchus or lung: Secondary | ICD-10-CM

## 2021-05-14 DIAGNOSIS — E785 Hyperlipidemia, unspecified: Secondary | ICD-10-CM | POA: Diagnosis not present

## 2021-05-14 LAB — COMPREHENSIVE METABOLIC PANEL
ALT: 31 U/L (ref 0–44)
AST: 28 U/L (ref 15–41)
Albumin: 2.7 g/dL — ABNORMAL LOW (ref 3.5–5.0)
Alkaline Phosphatase: 50 U/L (ref 38–126)
Anion gap: 10 (ref 5–15)
BUN: 20 mg/dL (ref 8–23)
CO2: 24 mmol/L (ref 22–32)
Calcium: 9.7 mg/dL (ref 8.9–10.3)
Chloride: 102 mmol/L (ref 98–111)
Creatinine, Ser: 1.63 mg/dL — ABNORMAL HIGH (ref 0.61–1.24)
GFR, Estimated: 43 mL/min — ABNORMAL LOW (ref >60)
Glucose, Bld: 100 mg/dL — ABNORMAL HIGH (ref 70–99)
Potassium: 3.6 mmol/L (ref 3.5–5.1)
Sodium: 136 mmol/L (ref 135–145)
Total Bilirubin: 0.8 mg/dL (ref 0.3–1.2)
Total Protein: 5.9 g/dL — ABNORMAL LOW (ref 6.5–8.1)

## 2021-05-14 LAB — CBC
HCT: 26.8 % — ABNORMAL LOW (ref 39.0–52.0)
Hemoglobin: 8.5 g/dL — ABNORMAL LOW (ref 13.0–17.0)
MCH: 30.1 pg (ref 26.0–34.0)
MCHC: 31.7 g/dL (ref 30.0–36.0)
MCV: 95 fL (ref 80.0–100.0)
Platelets: 235 10*3/uL (ref 150–400)
RBC: 2.82 MIL/uL — ABNORMAL LOW (ref 4.22–5.81)
RDW: 15.2 % (ref 11.5–15.5)
WBC: 6.5 10*3/uL (ref 4.0–10.5)
nRBC: 0 % (ref 0.0–0.2)

## 2021-05-14 IMAGING — MR MR FACE/TRIGEMINAL WO/W CM
5 of 8 series · 22 of 48 positions shown · IV contrast (gadavist)
Comparison: Neck CT [DATE].

CLINICAL DATA: 75-year-old male with recent tooth extractions,
questionable mandible osteomyelitis on CT yesterday.

EXAM:
MRI FACE TRIGEMINAL WITHOUT AND WITH CONTRAST
TECHNIQUE: Multiplanar, multi-echo pulse sequences of the face and surrounding
structures, including thin-slice imaging of the trigeminal nerves,
were acquired before and after intravenous contrast administration.
CONTRAST:  10mL GADAVIST GADOBUTROL 1 MMOL/ML IV SOLN

[Series 5: T1 · sagittal · 3.0mm · 0.35mm/px · 4 of 39 slices shown (1 of 3)]
[im 1/39]
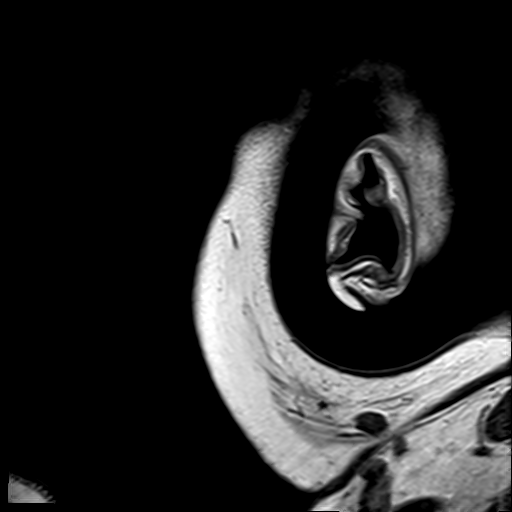
[im 13/39]
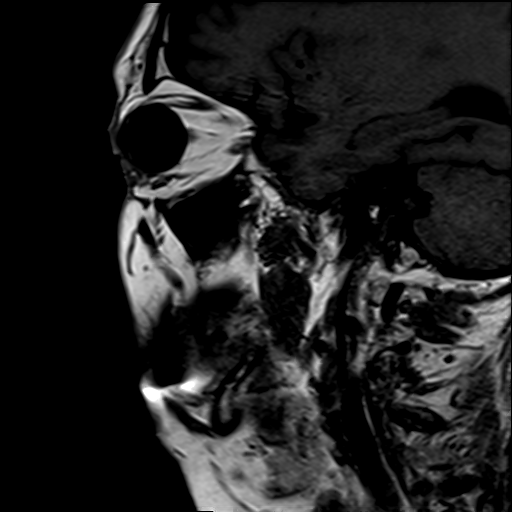
[im 26/39]
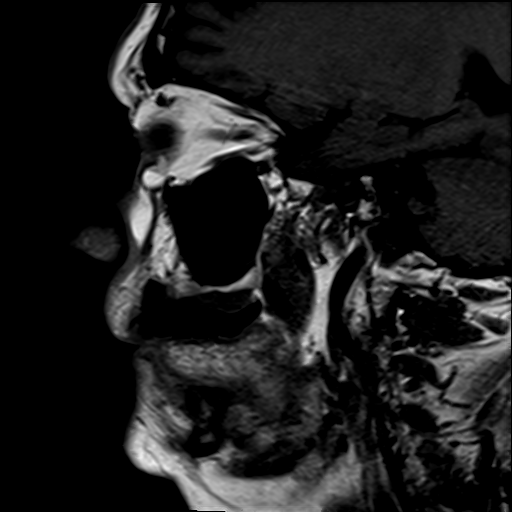
[im 39/39]
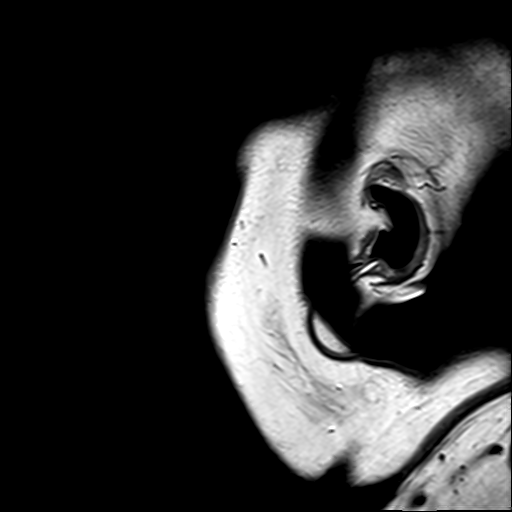

[Series 6: T2 · coronal · 3.0mm · 0.59mm/px · 5 of 38 slices shown]
[im 1/38]
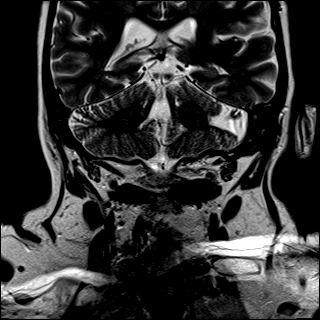
[im 10/38]
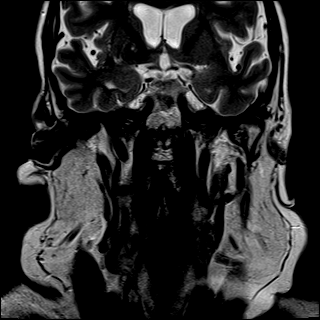
[im 19/38]
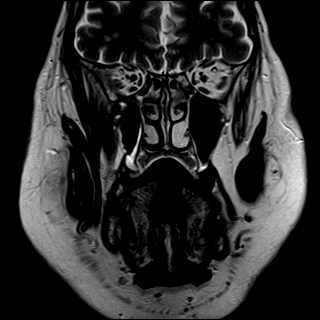
[im 28/38]
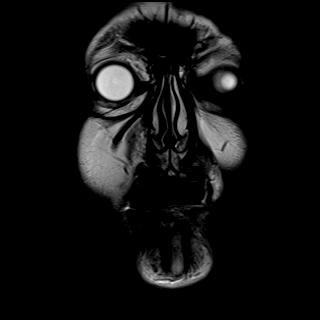
[im 38/38]
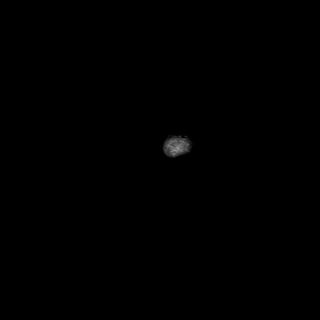

[Series 9: T1 · axial · 3.0mm · 0.35mm/px · z∈[-65,+75]mm · 6 of 44 slices shown (2 of 3)]
[im 1/44]
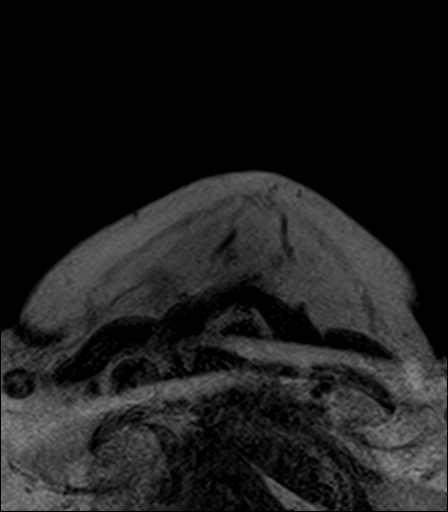
[im 9/44]
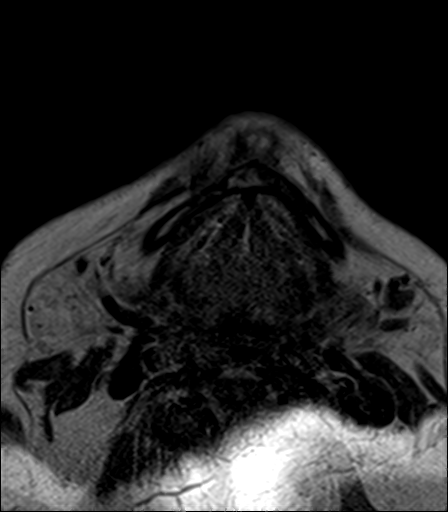
[im 18/44]
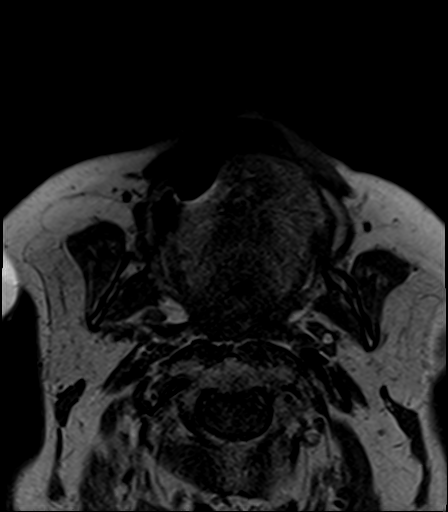
[im 26/44]
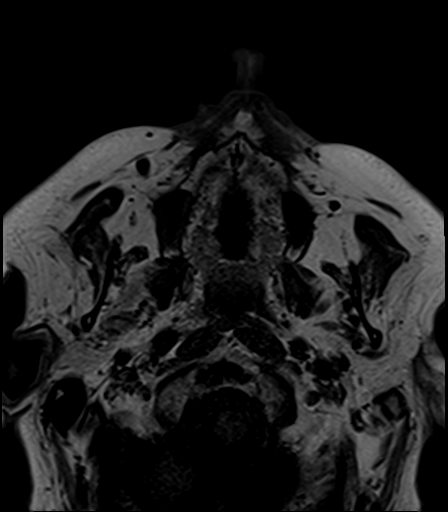
[im 35/44]
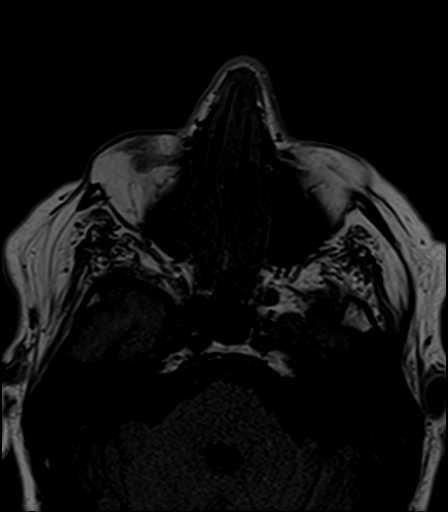
[im 44/44]
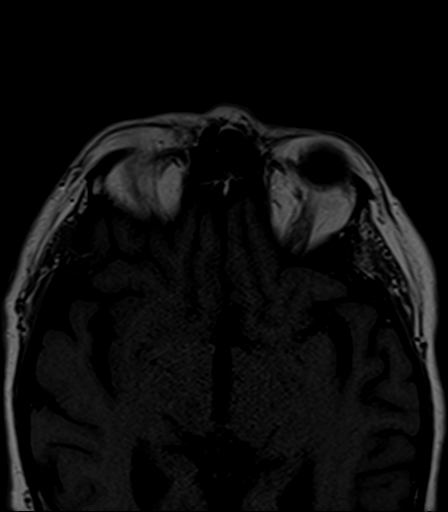

[Series 10: T1 · coronal · 3.0mm · 0.35mm/px · 6 of 45 slices shown (3 of 3)]
[im 1/45]
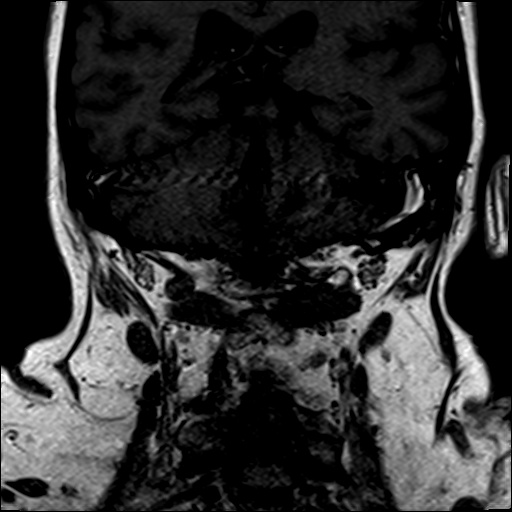
[im 9/45]
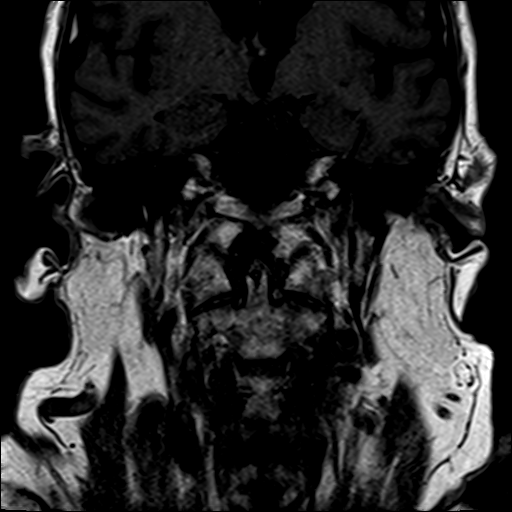
[im 18/45]
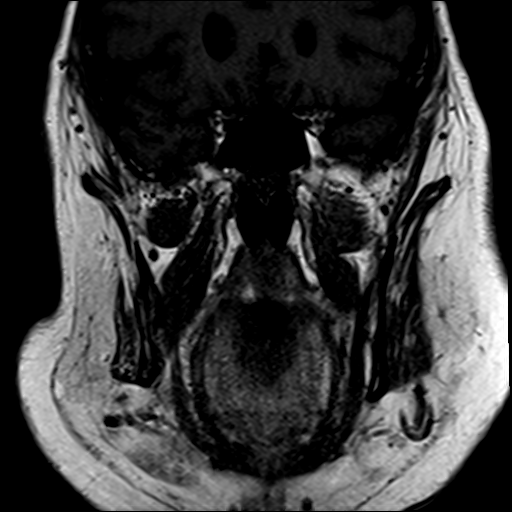
[im 27/45]
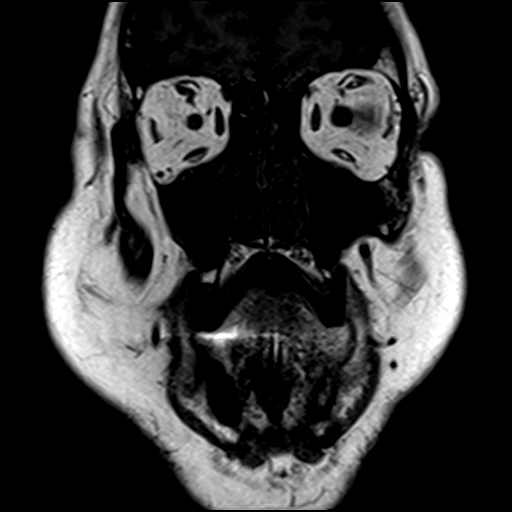
[im 36/45]
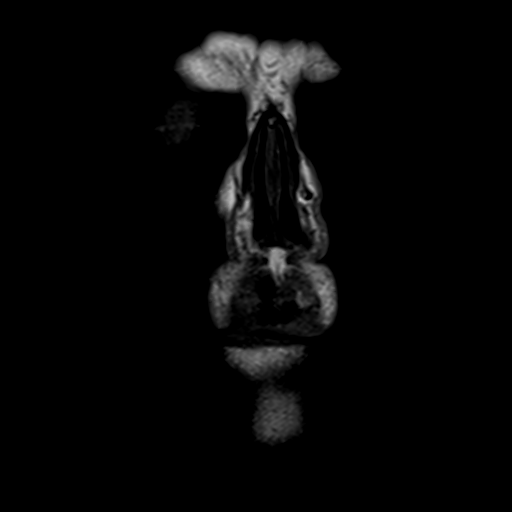
[im 45/45]
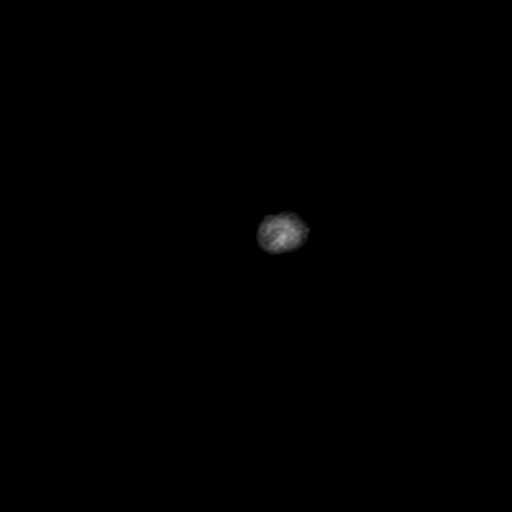

[Series 11: T1 fat-sat post-contrast · coronal · 3.0mm · 0.35mm/px · 1 of 45 slices shown]
[im 1/45]
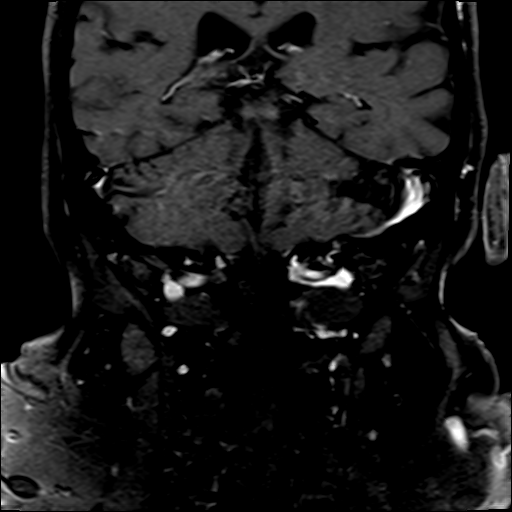

[22 of 48 positions shown; findings below may reference images not displayed]

FINDINGS: Limited intracranial/Trigeminal nerves: Negative. Fifth nerve
segments and cavernous sinus appears symmetric and within normal
limits.

Vascular: Major intracranial vascular flow voids are preserved. The
right vertebral artery appears to be dominant.

Sinuses/Orbits: Mild bilateral paranasal sinus mucosal thickening.
No sinus fluid levels. Tympanic cavities and mastoids appear clear.
Visible internal auditory structures appear normal.

Postoperative changes to both globes. Orbits otherwise appear
normal.

Soft tissues: Visible larynx, pharynx, parapharyngeal spaces,
retropharyngeal space, sublingual space, submandibular spaces, and
parotid spaces are symmetric and within normal limits.

Masticator spaces remain within normal limits. No upper cervical
lymphadenopathy.

Osseous: There is evidence of bone marrow edema in the anterior
mandible corresponding to multiple tooth extraction sites (series 5,
image 21 and series 7, image 10), but there is also some regional
dental metal susceptibility artifact (on the right). In conjunction
with the recent CT appearance this does not rise to the level of
overt mandible osteomyelitis. Normal marrow signal elsewhere in the
mandible. And on STIR imaging there is no obvious regional soft
tissue inflammation. No fluid collection or abscess.

Other: Normal for age visible cervical spine. Normal bone marrow
signal in the cervical spine and at the skull base.
IMPRESSION: 1. Un-healed anterior mandible tooth extraction sites without overt
mandible osteomyelitis at this time. And no abscess or other
complicating features.
If clinical suspicion of osteomyelitis persists then a repeat Face
or Neck CT in several weeks should suffice and could be compared to
that on [DATE].

2. Otherwise negative MRI appearance of the Face.

## 2021-05-14 MED ORDER — GABAPENTIN 600 MG PO TABS: 600.0000 mg | ORAL_TABLET | Freq: Two times a day (BID) | ORAL | Status: AC

## 2021-05-14 MED ORDER — LEVOFLOXACIN 750 MG PO TABS: 750.0000 mg | ORAL_TABLET | Freq: Every day | ORAL | 0 refills | Status: AC

## 2021-05-14 MED ORDER — XELJANZ 5 MG PO TABS: 1.0000 | ORAL_TABLET | Freq: Two times a day (BID) | ORAL | Status: AC

## 2021-05-14 MED ORDER — LOPERAMIDE HCL 2 MG PO CAPS
2.0000 mg | ORAL_CAPSULE | Freq: Four times a day (QID) | ORAL | Status: DC | PRN
  Administered 2021-05-14: 2 mg via ORAL
  Filled 2021-05-14: qty 1

## 2021-05-14 MED ORDER — GADOBUTROL 1 MMOL/ML IV SOLN
10.0000 mL | Freq: Once | INTRAVENOUS | Status: AC | PRN
  Administered 2021-05-14: 10 mL via INTRAVENOUS

## 2021-05-14 MED ORDER — ATORVASTATIN CALCIUM 10 MG PO TABS: 10.0000 mg | ORAL_TABLET | Freq: Every day | ORAL | Status: AC

## 2021-05-14 MED ORDER — METRONIDAZOLE 500 MG PO TABS: 500.0000 mg | ORAL_TABLET | Freq: Two times a day (BID) | ORAL | 0 refills | Status: AC

## 2021-05-14 NOTE — Discharge Summary (Signed)
Physician Discharge Summary   Patient: Sean Massey MRN: [id number] DOB: 1945/09/21  Admit date:     05/13/2021  Discharge date: 05/14/21  Discharge Physician: Kathie Dike   PCP: Sherrilee Gilles, DO   Recommendations at discharge:    Follow up with oncology for left upper lobe lung mass scheduled Follow up with primary dentist Dr. Sabra Heck in 1-2 weeks Follow up with primary GI at [facility] in 1-2 weeks  Discharge Diagnoses: Principal Problem:   AKI (acute kidney injury) ([place]) Active Problems:   Dyslipidemia   Hypertension   Ulcerative pancolitis without complication ([place])   Dental infection   Mass of upper lobe of left lung   Dehydration   Hypercalcemia   CKD (chronic kidney disease) stage 3, GFR 30-59 ml/min (HCC)  Resolved Problems:   * No resolved hospital problems. *   Hospital Course: No notes on file  Assessment and Plan: * AKI (acute kidney injury) ([place])- (present on admission) Patient has acute kidney injury on chronic kidney disease stage IIIb Baseline creatinine appears to be around 1.7 Admission creatinine 2.2 Likely related to dehydration Improved to 1.6 with hydration  CKD (chronic kidney disease) stage 3, GFR 30-59 ml/min (HCC)- (present on admission) Baseline creatinine approximately 1.7  Hypercalcemia- (present on admission) Likely related to dehydration and poor p.o. intake Improved with hydration  Dehydration- (present on admission) Related to poor p.o. intake, persistent diarrhea Resolved with IV fluids  Mass of upper lobe of left lung- (present on admission) Incidental finding noted on CT neck Discussed with Dr. Delton Coombes and patient has been scheduled for outpatient evaluation on 3/1  Dental infection- (present on admission) He is completed a course of clindamycin and amoxicillin prior to admission Currently on levofloxacin and Flagyl MRI neg for osteomyelitis of mandible, no abscess Clinically he is feeling better with antibiotics Follow  up with primary dentist  Ulcerative pancolitis without complication ([place])- (present on admission) Discussed with Dr. Laural Golden and will hold Morrie Sheldon while patient is on antibiotics He will need to follow up with primary GI at [facility] Stool C diff negative Diarrhea improving with imodium on discharge  Hypertension- (present on admission) Holding antihypertensives for now since he was hypotensive on arrival Resume as blood pressure tolerates  Dyslipidemia- (present on admission) Continue on statin         Pain control - [facility] Controlled Substance Reporting System database was reviewed. and patient was instructed, not to drive, operate heavy machinery, perform activities at heights, swimming or participation in water activities or provide baby-sitting services while on Pain, Sleep and Anxiety Medications; until their outpatient Physician has advised to do so again. Also recommended to not to take more than prescribed Pain, Sleep and Anxiety Medications.   Consultants:  Procedures performed:   Disposition: Home Diet recommendation:  Discharge Diet Orders (From admission, onward)     Start     Ordered   05/14/21 0000  Diet - low sodium heart healthy        05/14/21 1623           Regular diet  DISCHARGE MEDICATION: Allergies as of 05/14/2021       Reactions   Cefdinir Other (See Comments)   Other reaction(s): Other (See Comments) Cannot take - causes problems with colitis---- "Worsens Diarrhea" Cannot take - causes problems with colitis Cannot take - causes problems with colitis---- "Worsens Diarrhea" Other reaction(s): Other (See Comments) Cannot take - causes problems with colitis---- "Worsens Diarrhea" Cannot take - causes problems with colitis  No Known Allergies         Medication List     STOP taking these medications    tamsulosin 0.4 MG Caps capsule Commonly known as: FLOMAX   telmisartan 80 MG tablet Commonly known as: MICARDIS       TAKE  these medications    albuterol 108 (90 Base) MCG/ACT inhaler Commonly known as: VENTOLIN HFA Inhale 2 puffs into the lungs every 6 (six) hours as needed for shortness of breath.   ALPRAZolam 0.5 MG tablet Commonly known as: XANAX Take 0.5 mg by mouth as needed.   aspirin 325 MG EC tablet Take 325 mg by mouth daily.   atorvastatin 10 MG tablet Commonly known as: LIPITOR Take 1 tablet (10 mg total) by mouth at bedtime. What changed:  how much to take how to take this   Caltrate 600+D3 Soft 600-20 MG-MCG Chew Generic drug: Calcium Carb-Cholecalciferol   fluticasone 50 MCG/ACT nasal spray Commonly known as: FLONASE Place 1 spray into both nostrils daily.   gabapentin 600 MG tablet Commonly known as: NEURONTIN Take 1 tablet (600 mg total) by mouth 2 (two) times daily. What changed:  how much to take how to take this   hyoscyamine 0.125 MG tablet Commonly known as: LEVSIN Take 0.125 mg by mouth every 6 (six) hours as needed for cramping.   levofloxacin 750 MG tablet Commonly known as: Levaquin Take 1 tablet (750 mg total) by mouth daily for 5 days.   loperamide 2 MG tablet Commonly known as: IMODIUM A-D Take 2 mg by mouth 4 (four) times daily as needed for diarrhea or loose stools.   metroNIDAZOLE 500 MG tablet Commonly known as: Flagyl Take 1 tablet (500 mg total) by mouth 2 (two) times daily for 5 days.   PROTONIX PO Take 40 mg by mouth 2 (two) times daily.   verapamil 240 MG CR tablet Commonly known as: CALAN-SR Take 240 mg by mouth daily.   Xeljanz 5 MG Tabs Generic drug: Tofacitinib Citrate Take 1 tablet (5 mg total) by mouth 2 (two) times daily. Resume once antibiotics are complete What changed:  how much to take additional instructions        Follow-up Information     Derek Jack, MD Follow up on 05/27/2021.   Specialty: Hematology Why: 1:15pm Contact information: [place] [address] [phone]         follow up  with dentist Dr. Sabra Heck in 1-2 weeks Follow up.          Mosie Epstein, MD. Schedule an appointment as soon as possible for a visit in 2 week(s).   Specialty: Pediatrics Why: for ulcerative colitis Contact information: [place] [address] [phone]         Sherrilee Gilles, DO. Schedule an appointment as soon as possible for a visit in 2 week(s).   Specialty: Family Medicine Contact information: [place] [address] [phone]                 Discharge Exam: Danley Danker Weights   05/13/21 1206  Weight: 105 kg   General exam: Alert, awake, oriented x 3 Respiratory system: Clear to auscultation. Respiratory effort normal. Cardiovascular system:RRR. No murmurs, rubs, gallops. Gastrointestinal system: Abdomen is nondistended, soft and nontender. No organomegaly or masses felt. Normal bowel sounds heard. Central nervous system: Alert and oriented. No focal neurological deficits. Extremities: No C/C/E, +pedal pulses Skin: No rashes, lesions or ulcers Psychiatry:  Judgement and insight appear normal. Mood & affect appropriate.    Condition at discharge: good  The results of significant diagnostics from this hospitalization (including imaging, microbiology, ancillary and laboratory) are listed below for reference.   Imaging Studies: CT Soft Tissue Neck Wo Contrast  Result Date: 05/13/2021 CLINICAL DATA:  Epiglottitis or tonsillitis suspected EXAM: CT NECK WITHOUT CONTRAST TECHNIQUE: Multidetector CT imaging of the neck was performed following the standard protocol without intravenous contrast. RADIATION DOSE REDUCTION: This exam was performed according to the departmental dose-optimization program which includes automated exposure control, adjustment of the mA and/or kV according to patient size and/or use of iterative reconstruction technique. COMPARISON:  None. FINDINGS: Pharynx and larynx: Normal. No mass or swelling. Salivary  glands: No inflammation, mass, or stone. Thyroid: Normal. Lymph nodes: None enlarged or abnormal density. Vascular: Nondiagnostic evaluation without contrast. Limited intracranial: Negative. Visualized orbits: Negative. Mastoids and visualized paranasal sinuses: Clear. Skeleton: Multilevel degenerative change. Upper chest: Spiculated 2.0 x 1.3 cm masslike consolidation in the posterior aspect of the left upper lobe along the fissure. Other: Multiple missing teeth along the anterior mandible with empty tooth sockets and mild surrounding fat stranding. No discrete drainable fluid collection identified. There is heterogeneous sclerosis and lucency of the surrounding mandible. IMPRESSION: 1. Spiculated 2.0 x 1.3 cm masslike consolidation in the posterior aspect of the left upper lobe along the fissure. Given the patient's clinical history, this could represent pneumonia, but bronchogenic carcinoma is not excluded. Recommend short interval follow-up chest CT after a trial of antibiotics to ensure resolution and exclude malignancy. 2. Multiple missing teeth along the anterior mandible with empty tooth sockets, surrounding lucency and sclerosis of the mandible, and mild overlying soft tissue thickening/swelling. Given the patient's history of recent tooth extractions and infection, findings could represent infection and possibly osteomyelitis. Recommend correlation with direct inspection. No discrete, drainable fluid collection identified on this noncontrast study. Findings and recommendations discussed with Dr. Regenia Skeeter via telephone at 2:30 p.m. Electronically Signed   By: Margaretha Sheffield M.D.   On: 05/13/2021 14:42   DG Chest Port 1 View  Result Date: 05/13/2021 CLINICAL DATA:  Chest discomfort, chills, nausea and vomiting EXAM: PORTABLE CHEST 1 VIEW COMPARISON:  None. FINDINGS: Transverse diameter of heart is increased. There is poor inspiration. There are linear densities in the lower lung fields. There is no  pleural effusion or pneumothorax. There is deformity in the right clavicle suggesting old fracture. IMPRESSION: Cardiomegaly. There are no signs of pulmonary edema. There are linear densities in both lower lung fields suggesting scarring or subsegmental atelectasis. Electronically Signed   By: Elmer Picker M.D.   On: 05/13/2021 13:13   MR FACE/TRIGEMINAL WO/W CM  Result Date: 05/14/2021 CLINICAL DATA:  76 year old male with recent tooth extractions, questionable mandible osteomyelitis on CT yesterday. EXAM: MRI FACE TRIGEMINAL WITHOUT AND WITH CONTRAST TECHNIQUE: Multiplanar, multi-echo pulse sequences of the face and surrounding structures, including thin-slice imaging of the trigeminal nerves, were acquired before and after intravenous contrast administration. CONTRAST:  84mL GADAVIST GADOBUTROL 1 MMOL/ML IV SOLN COMPARISON:  Neck CT 05/13/2021. FINDINGS: Limited intracranial/Trigeminal nerves: Negative. Fifth nerve segments and cavernous sinus appears symmetric and within normal limits. Vascular: Major intracranial vascular flow voids are preserved. The right vertebral artery appears to be dominant. Sinuses/Orbits: Mild bilateral paranasal sinus mucosal thickening. No sinus fluid levels. Tympanic cavities and mastoids appear clear. Visible internal auditory structures appear normal. Postoperative changes to both globes. Orbits otherwise appear normal. Soft tissues: Visible larynx, pharynx, parapharyngeal spaces,  retropharyngeal space, sublingual space, submandibular spaces, and parotid spaces are symmetric and within normal limits. Masticator spaces remain within normal limits. No upper cervical lymphadenopathy. Osseous: There is evidence of bone marrow edema in the anterior mandible corresponding to multiple tooth extraction sites (series 5, image 21 and series 7, image 10), but there is also some regional dental metal susceptibility artifact (on the right). In conjunction with the recent CT  appearance this does not rise to the level of overt mandible osteomyelitis. Normal marrow signal elsewhere in the mandible. And on STIR imaging there is no obvious regional soft tissue inflammation. No fluid collection or abscess. Other: Normal for age visible cervical spine. Normal bone marrow signal in the cervical spine and at the skull base. IMPRESSION: 1. Un-healed anterior mandible tooth extraction sites without overt mandible osteomyelitis at this time. And no abscess or other complicating features. If clinical suspicion of osteomyelitis persists then a repeat Face or Neck CT in several weeks should suffice and could be compared to that on 05/14/2011. 2. Otherwise negative MRI appearance of the Face. Electronically Signed   By: Genevie Ann M.D.   On: 05/14/2021 09:02    Microbiology: Results for orders placed or performed during the hospital encounter of 05/13/21  Culture, blood (Routine x 2)     Status: None (Preliminary result)   Collection Time: 05/13/21 12:20 PM   Specimen: BLOOD  Result Value Ref Range Status   Specimen Description BLOOD BLOOD LEFT HAND  Final   Special Requests   Final    BOTTLES DRAWN AEROBIC AND ANAEROBIC Blood Culture results may not be optimal due to an excessive volume of blood received in culture bottles   Culture   Final    NO GROWTH < 24 HOURS Performed at [facility], [address], [place], [place] [address]    Report Status PENDING  Incomplete  Culture, blood (Routine x 2)     Status: None (Preliminary result)   Collection Time: 05/13/21 12:22 PM   Specimen: BLOOD  Result Value Ref Range Status   Specimen Description BLOOD LEFT ANTECUBITAL  Final   Special Requests   Final    BOTTLES DRAWN AEROBIC AND ANAEROBIC Blood Culture adequate volume   Culture   Final    NO GROWTH < 24 HOURS Performed at [facility], [address] [place], [place] [address]    Report Status PENDING  Incomplete  Resp Panel by RT-PCR (Flu A&B, Covid) Nasopharyngeal Swab      Status: None   Collection Time: 05/13/21  2:21 PM   Specimen: Nasopharyngeal Swab; Nasopharyngeal(NP) swabs in vial transport medium  Result Value Ref Range Status   SARS Coronavirus 2 by RT PCR NEGATIVE NEGATIVE Final    Comment: (NOTE) SARS-CoV-2 target nucleic acids are NOT DETECTED.  The SARS-CoV-2 RNA is generally detectable in upper respiratory specimens during the acute phase of infection. The lowest concentration of SARS-CoV-2 viral copies this assay can detect is 138 copies/mL. A negative result does not preclude SARS-Cov-2 infection and should not be used as the sole basis for treatment or other patient management decisions. A negative result may occur with  improper specimen collection/handling, submission of specimen other than nasopharyngeal swab, presence of viral mutation(s) within the areas targeted by this assay, and inadequate number of viral copies(<138 copies/mL). A negative result must be combined with clinical observations, patient history, and epidemiological information. The expected result is Negative.  Fact Sheet for Patients:  EntrepreneurPulse.com.au  Fact Sheet for Healthcare Providers:  IncredibleEmployment.be  This test is no t yet approved or cleared by the [place] and  has been authorized for detection and/or diagnosis of SARS-CoV-2 by FDA under an Emergency Use Authorization (EUA). This EUA will remain  in effect (meaning this test can be used) for the duration of the COVID-19 declaration under Section 564(b)(1) of the Act, 21 U.S.C.section 360bbb-3(b)(1), unless the authorization is terminated  or revoked sooner.       Influenza A by PCR NEGATIVE NEGATIVE Final   Influenza B by PCR NEGATIVE NEGATIVE Final    Comment: (NOTE) The Xpert Xpress SARS-CoV-2/FLU/RSV plus assay is intended as an aid in the diagnosis of influenza from Nasopharyngeal swab specimens and should not be used as a sole basis  for treatment. Nasal washings and aspirates are unacceptable for Xpert Xpress SARS-CoV-2/FLU/RSV testing.  Fact Sheet for Patients: EntrepreneurPulse.com.au  Fact Sheet for Healthcare Providers: IncredibleEmployment.be  This test is not yet approved or cleared by the [place] FDA and has been authorized for detection and/or diagnosis of SARS-CoV-2 by FDA under an Emergency Use Authorization (EUA). This EUA will remain in effect (meaning this test can be used) for the duration of the COVID-19 declaration under Section 564(b)(1) of the Act, 21 U.S.C. section 360bbb-3(b)(1), unless the authorization is terminated or revoked.  Performed at [facility], [address] [place], [place] [address]   C Difficile Quick Screen w PCR reflex     Status: None   Collection Time: 05/13/21  7:22 PM   Specimen: STOOL  Result Value Ref Range Status   C Diff antigen NEGATIVE NEGATIVE Final   C Diff toxin NEGATIVE NEGATIVE Final   C Diff interpretation No C. difficile detected.  Final    Comment: Performed at [facility], [address] [place], [place] [address]    Labs: CBC: Recent Labs  Lab 05/13/21 1220 05/14/21 0513  WBC 12.1* 6.5  NEUTROABS 10.4*  --   HGB 10.6* 8.5*  HCT 34.1* 26.8*  MCV 93.4 95.0  PLT 313 627   Basic Metabolic Panel: Recent Labs  Lab 05/13/21 1220 05/14/21 0513  NA 133* 136  K 4.0 3.6  CL 98 102  CO2 25 24  GLUCOSE 133* 100*  BUN 21 20  CREATININE 2.29* 1.63*  CALCIUM 10.5* 9.7   Liver Function Tests: Recent Labs  Lab 05/13/21 1220 05/14/21 0513  AST 44* 28  ALT 44 31  ALKPHOS 67 50  BILITOT 0.8 0.8  PROT 7.7 5.9*  ALBUMIN 3.8 2.7*   CBG: No results for input(s): GLUCAP in the last 168 hours.  Discharge time spent: greater than 30 minutes.  Signed: Kathie Dike, MD [facility] 05/14/2021

## 2021-05-14 NOTE — Hospital Course (Signed)
Patient admitted with dehydration, acute kidney injury due to persistent diarrhea and poor po intake. He has a history of ulcerative colitis and developed worsening diarrhea when started on antibiotics for dental infection. Stool C diff negative. Imaging did not indicate any underlying abscess or osteomyelitis. Dental infection improved with IV antibiotics. Diarrhea improved with imodium. Dehydration resolved with IV hydration. Renal function back to baseline with IV fluids.

## 2021-05-14 NOTE — Care Management Obs Status (Signed)
[facility] NOTIFICATION   Patient Details  Name: Sean Massey MRN: [id number] Date of Birth: [birth year]   Medicare Observation Status Notification Given:  Yes    Ihor Gully, LCSW 05/14/2021, 4:22 PM

## 2021-05-14 NOTE — Progress Notes (Signed)
PET scan order placed per Dr. Delton Coombes

## 2021-05-14 NOTE — Plan of Care (Signed)

## 2021-05-14 NOTE — Plan of Care (Signed)

## 2021-05-14 NOTE — Progress Notes (Signed)
°  Transition of Care [facility]) Screening Note   Patient Details  Name: Sean Massey Date of Birth: [birth year]   Transition of Care [facility]) CM/SW Contact:    Ihor Gully, LCSW Phone Number: 05/14/2021, 4:33 PM    Transition of Care Department [facility]) has reviewed patient and no TOC needs have been identified at this time. We will continue to monitor patient advancement through interdisciplinary progression rounds. If new patient transition needs arise, please place a TOC consult.   Trestin Vences, Clydene Pugh, LCSW

## 2021-05-19 LAB — CULTURE, BLOOD (ROUTINE X 2)
Culture: NO GROWTH
Culture: NO GROWTH
Special Requests: ADEQUATE

## 2021-05-21 ENCOUNTER — Other Ambulatory Visit (HOSPITAL_COMMUNITY): Payer: Medicare PPO

## 2021-05-27 ENCOUNTER — Ambulatory Visit (HOSPITAL_COMMUNITY): Payer: Medicare PPO | Admitting: Hematology

## 2021-06-04 ENCOUNTER — Other Ambulatory Visit: Payer: Self-pay

## 2021-06-04 ENCOUNTER — Encounter (HOSPITAL_COMMUNITY)
Admission: RE | Admit: 2021-06-04 | Discharge: 2021-06-04 | Disposition: A | Discharge status: Home / Self Care | Payer: Medicare PPO | Source: Ambulatory Visit | Attending: Hematology | Admitting: Hematology

## 2021-06-04 DIAGNOSIS — C3490 Malignant neoplasm of unspecified part of unspecified bronchus or lung: Secondary | ICD-10-CM | POA: Insufficient documentation

## 2021-06-04 IMAGING — CT NM PET TUM IMG INITIAL (PI) SKULL BASE T - THIGH
7 series · 25 of 25 positions shown · non-contrast
Comparison: Neck CT [DATE]

CLINICAL DATA: Initial treatment strategy for non-small cell lung
cancer.

EXAM:
NUCLEAR MEDICINE PET SKULL BASE TO THIGH
TECHNIQUE: 12.14 mCi F-18 FDG was injected intravenously. Full-ring PET imaging
was performed from the skull base to thigh after the radiotracer. CT
data was obtained and used for attenuation correction and anatomic
localization.
Fasting blood glucose: 113 mg/dl

[Series 3: ctac · axial · 3.0mm · 0.98mm/px · z∈[-942,-66]mm · 4 of 293 slices shown]
[im 1/293]
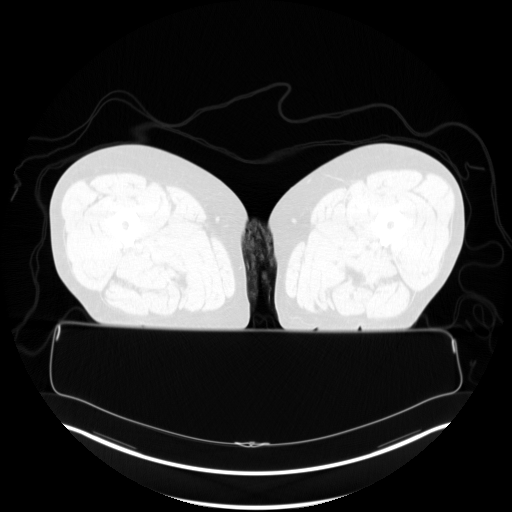
[im 98/293]
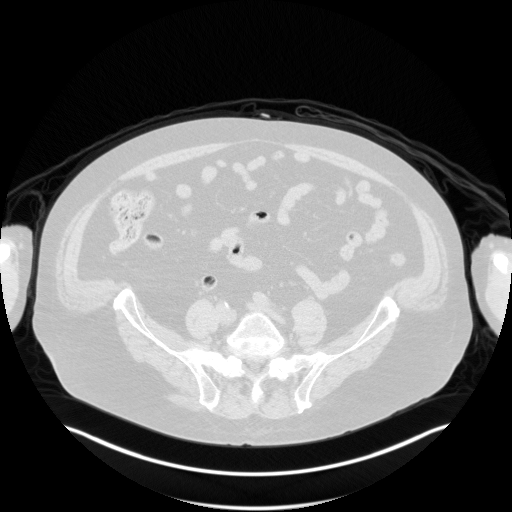
[im 195/293]
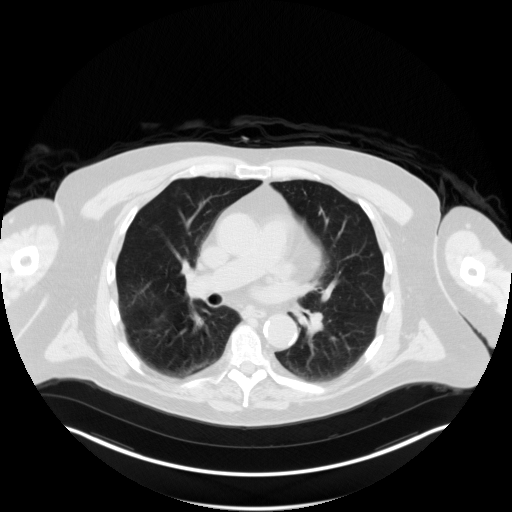
[im 293/293  brain]
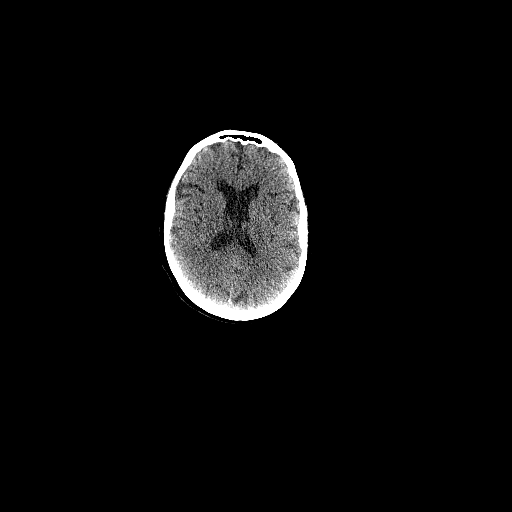

[Series 4: pet ac · axial · 3.0mm · 4.11mm/px · z∈[-942,-66]mm · 4 of 293 slices shown]
[im 1/293]
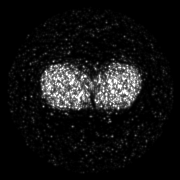
[im 98/293]
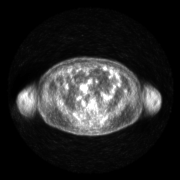
[im 195/293]
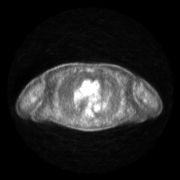
[im 293/293]
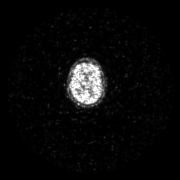

[Series 5: pet nac · axial · 3.0mm · 4.11mm/px · z∈[-942,-66]mm · 4 of 293 slices shown]
[im 1/293]
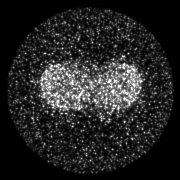
[im 98/293]
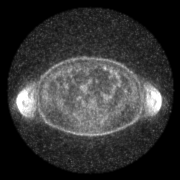
[im 195/293]
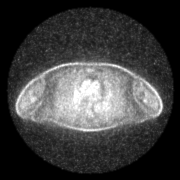
[im 293/293]
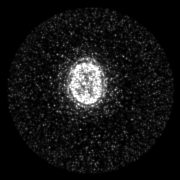

[Series 7: ct lung · axial · 3.0mm · 0.98mm/px · z∈[-942,-66]mm · 4 of 293 slices shown]
[im 1/293]
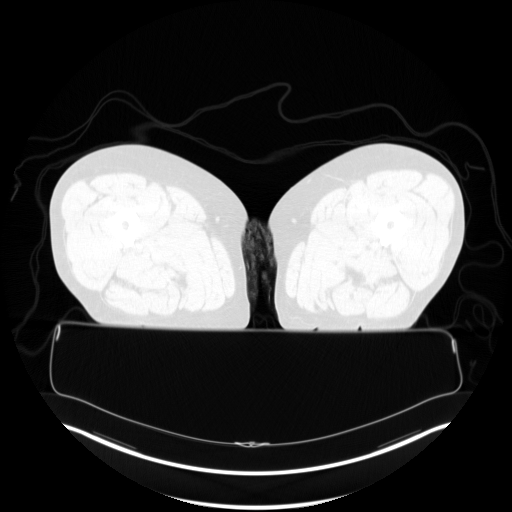
[im 98/293]
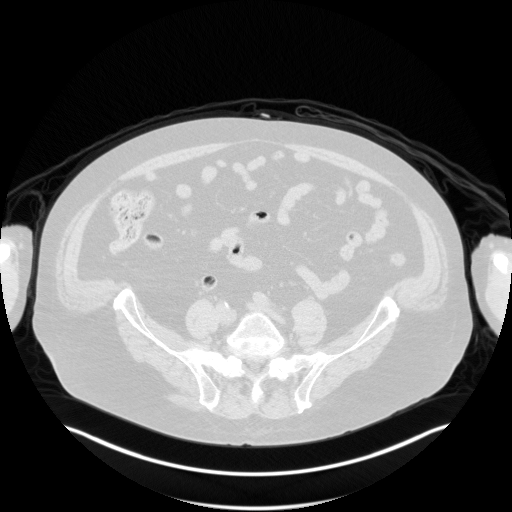
[im 195/293]
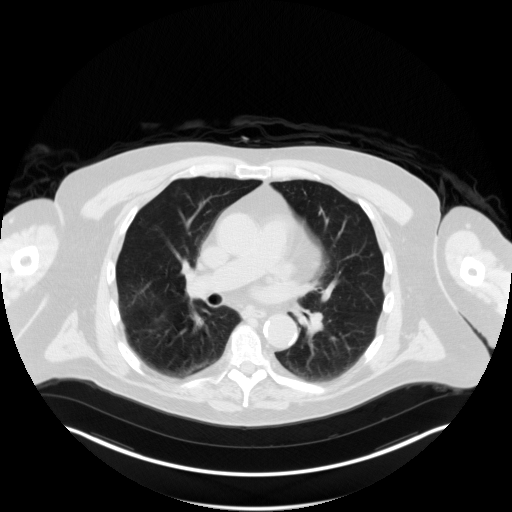
[im 293/293  brain]
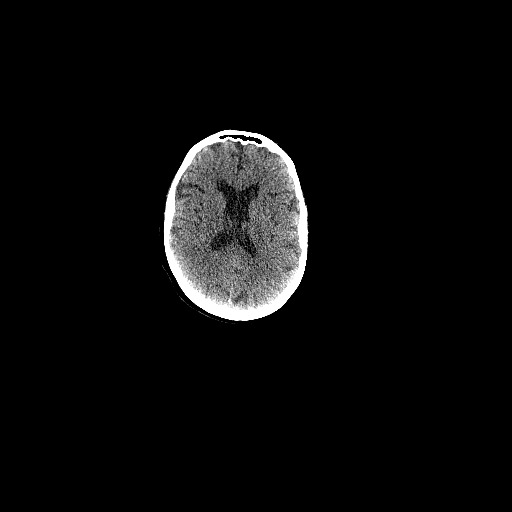

[Series 606: fused tra · 6 of 438 slices shown]
[im 1/438]
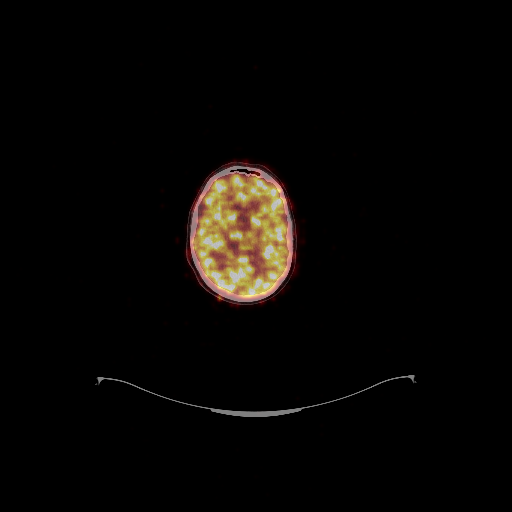
[im 88/438]
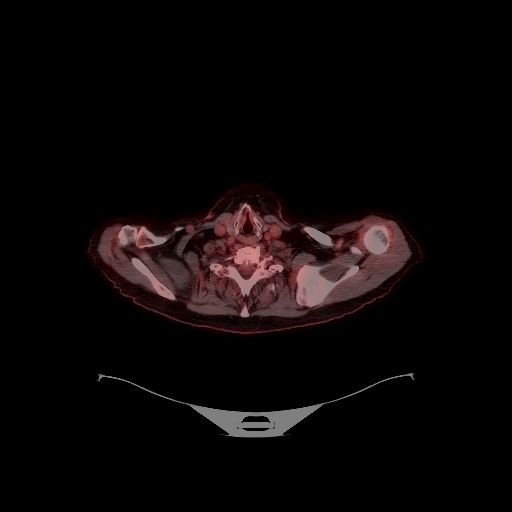
[im 175/438]
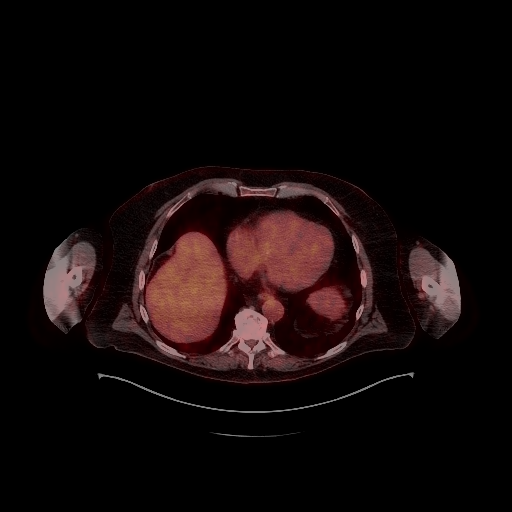
[im 263/438]
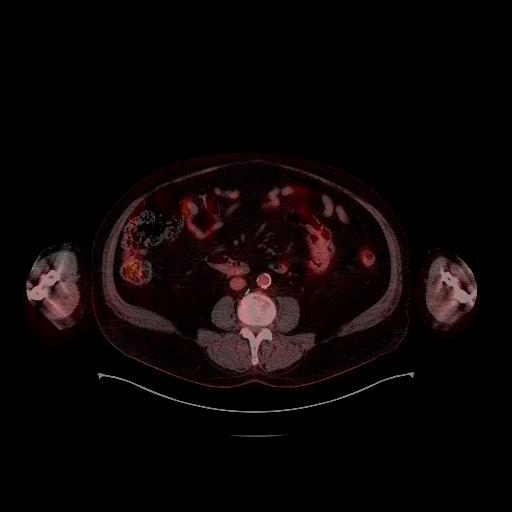
[im 350/438]
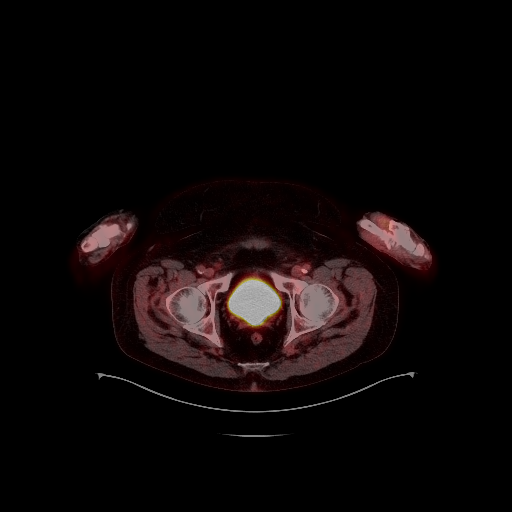
[im 438/438]
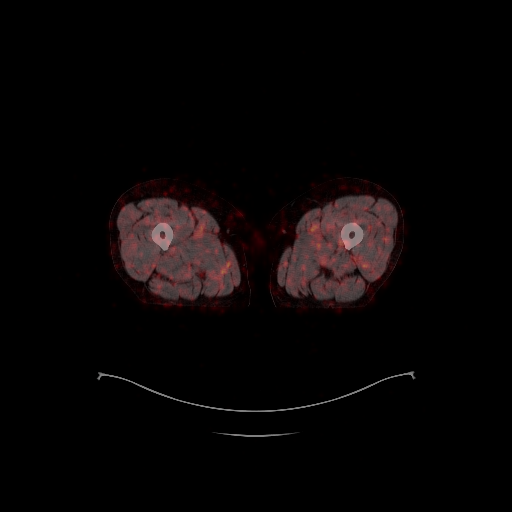

[Series 608: fused cor · 2 of 160 slices shown]
[im 1/160]
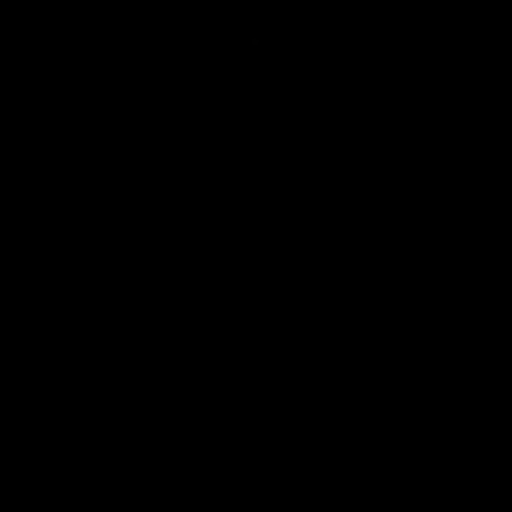
[im 160/160]
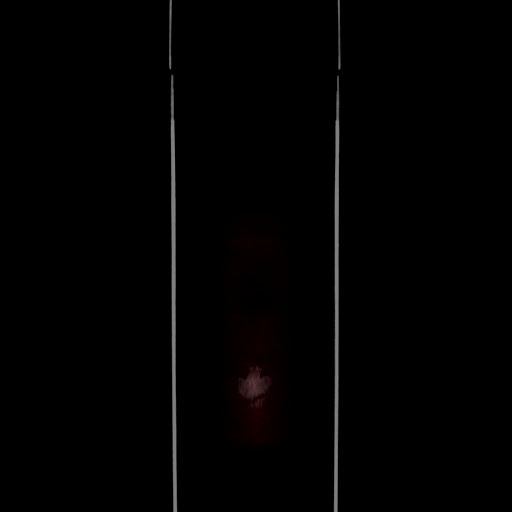

[Series 609: mip cine · coronal · 1.82mm/px · 1 of 48 slices shown]
[im 1/48]
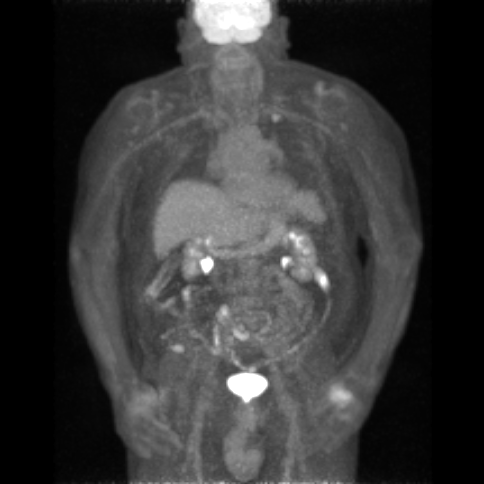

[25 of 25 positions shown; findings below may reference images not displayed]

FINDINGS: Mediastinal blood pool activity: SUV max

Liver activity: SUV max NA

NECK: No hypermetabolic lymph nodes in the neck.

Incidental CT findings: Bilateral carotid artery calcifications.

CHEST: 19 mm left upper lobe pulmonary lesion adjacent to the major
fissure is hypermetabolic with SUV max of 4.32 and consistent with
known neoplasm. No enlarged or hypermetabolic mediastinal or hilar
lymph nodes to suggest metastatic adenopathy. No supraclavicular or
axillary adenopathy. No worrisome pulmonary nodules to suggest
pulmonary metastatic disease.

Incidental CT findings: Emphysematous changes and pulmonary
scarring. Aortic and three-vessel coronary artery calcifications.

ABDOMEN/PELVIS: No findings suspicious for abdominal/pelvic
metastatic disease. No hepatic or adrenal gland lesions. No
abdominal lymphadenopathy.

Incidental CT findings: Moderate vascular calcifications but no
aneurysm. Cholelithiasis. Brachytherapy seeds are noted in prostate
gland.

SKELETON: No findings suspicious for osseous metastatic disease.

Incidental CT findings: No worrisome lytic or sclerotic bone lesions
on the CT scan. There are changes of vertebral augmentation noted in
the thoracic and lumbar spines remote healed right clavicle
fracture.
IMPRESSION: 1. Hypermetabolic left upper lobe lung lesion consistent with known
neoplasm.
2. No PET-CT findings for metastatic mediastinal or hilar
lymphadenopathy.
3. No evidence of metastatic disease involving the abdomen/pelvis or
bony structures.

## 2021-06-04 MED ORDER — FLUDEOXYGLUCOSE F - 18 (FDG) INJECTION
12.1400 | Freq: Once | INTRAVENOUS | Status: AC | PRN
  Administered 2021-06-04: 12:00:00 12.14 via INTRAVENOUS

## 2021-06-09 ENCOUNTER — Inpatient Hospital Stay (HOSPITAL_COMMUNITY): Payer: Medicare PPO | Attending: Hematology | Admitting: Hematology

## 2021-06-09 ENCOUNTER — Other Ambulatory Visit: Payer: Self-pay

## 2021-06-09 ENCOUNTER — Other Ambulatory Visit (HOSPITAL_COMMUNITY)
Admission: RE | Admit: 2021-06-09 | Discharge: 2021-06-09 | Disposition: A | Discharge status: Home / Self Care | Payer: Medicare PPO | Source: Ambulatory Visit | Attending: Hematology | Admitting: Hematology

## 2021-06-09 ENCOUNTER — Encounter (HOSPITAL_COMMUNITY): Payer: Self-pay | Admitting: Hematology

## 2021-06-09 ENCOUNTER — Inpatient Hospital Stay (HOSPITAL_COMMUNITY): Payer: Medicare PPO

## 2021-06-09 VITALS — BP 111/76 | HR 69 | Temp 96.9°F | Resp 17 | Ht 69.0 in | Wt 226.2 lb

## 2021-06-09 DIAGNOSIS — R918 Other nonspecific abnormal finding of lung field: Secondary | ICD-10-CM | POA: Insufficient documentation

## 2021-06-09 DIAGNOSIS — Z87891 Personal history of nicotine dependence: Secondary | ICD-10-CM | POA: Insufficient documentation

## 2021-06-09 DIAGNOSIS — C3490 Malignant neoplasm of unspecified part of unspecified bronchus or lung: Secondary | ICD-10-CM

## 2021-06-09 DIAGNOSIS — Z803 Family history of malignant neoplasm of breast: Secondary | ICD-10-CM | POA: Insufficient documentation

## 2021-06-09 DIAGNOSIS — Z808 Family history of malignant neoplasm of other organs or systems: Secondary | ICD-10-CM | POA: Insufficient documentation

## 2021-06-09 DIAGNOSIS — Z8546 Personal history of malignant neoplasm of prostate: Secondary | ICD-10-CM | POA: Insufficient documentation

## 2021-06-09 DIAGNOSIS — Z20822 Contact with and (suspected) exposure to covid-19: Secondary | ICD-10-CM | POA: Insufficient documentation

## 2021-06-09 DIAGNOSIS — Z79899 Other long term (current) drug therapy: Secondary | ICD-10-CM | POA: Diagnosis not present

## 2021-06-09 LAB — PSA: Prostatic Specific Antigen: <0.01 ng/mL (ref 0.00–4.00)

## 2021-06-09 LAB — SARS CORONAVIRUS 2 (TAT 6-24 HRS): SARS Coronavirus 2: NEGATIVE

## 2021-06-09 NOTE — Patient Instructions (Addendum)
[place] at [facility] ?Discharge Instructions ? ?You were seen and examined today by Dr. Delton Coombes. Dr. Delton Coombes is a medical oncologist, meaning that he specializes in the treatment of cancer diagnoses. Dr. Delton Coombes discussed your past medical history, family history of cancers, and the events that led to you being here today. ? ?You were referred to Dr. Delton Coombes due to a lung mass.  ? ?Dr. Delton Coombes reviewed your recent PET scan which revealed a lung mass concerning lung cancer with no spread elsewhere. It appears to be  a Stage I Lung Cancer. The curative treatment for this would be to surgically remove the cancer. Dr. Delton Coombes will refer you to a cardiothoracic surgeon in [place]. Prior to seeing them, you will need a brain MRI as this is done for any patient with a lung mass. You will also need a pulmonary (lung) function test. ? ?Dr. Delton Coombes has recommended checking a PSA due to your history of prostate cancer. ? ?Follow-up with Dr. Delton Coombes as scheduled. ? ? ?Thank you for choosing [place] at [facility] to provide your oncology and hematology care.  To afford each patient quality time with our provider, please arrive at least 15 minutes before your scheduled appointment time.  ? ?If you have a lab appointment with the [facility] please come in thru the Main Entrance and check in at the main information desk. ? ?You need to re-schedule your appointment should you arrive 10 or more minutes late.  We strive to give you quality time with our providers, and arriving late affects you and other patients whose appointments are after yours.  Also, if you no show three or more times for appointments you may be dismissed from the clinic at the providers discretion.     ?Again, thank you for choosing [facility].  Our hope is that these requests will decrease the amount of time that you wait before being seen by our physicians.        ?_____________________________________________________________ ? ?Should you have questions after your visit to [facility], please contact our office at [phone] and follow the prompts.  Our office hours are 8:00 a.m. and 4:30 p.m. Monday - Friday.  Please note that voicemails left after 4:00 p.m. may not be returned until the following business day.  We are closed weekends and major holidays.  You do have access to a nurse 24-7, just call the main number to the clinic [phone] and do not press any options, hold on the line and a nurse will answer the phone.   ? ?For prescription refill requests, have your pharmacy contact our office and allow 72 hours.   ? ?Due to Covid, you will need to wear a mask upon entering the hospital. If you do not have a mask, a mask will be given to you at the Main Entrance upon arrival. For doctor visits, patients may have 1 support person age 37 or older with them. For treatment visits, patients can not have anyone with them due to social distancing guidelines and our immunocompromised population.  ? ? ? ?
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
## 2021-06-12 ENCOUNTER — Ambulatory Visit (HOSPITAL_COMMUNITY)
Admission: RE | Admit: 2021-06-12 | Discharge: 2021-06-12 | Disposition: A | Discharge status: Home / Self Care | Payer: Medicare PPO | Source: Ambulatory Visit | Attending: Hematology | Admitting: Hematology

## 2021-06-12 ENCOUNTER — Other Ambulatory Visit: Payer: Self-pay

## 2021-06-12 DIAGNOSIS — Z87891 Personal history of nicotine dependence: Secondary | ICD-10-CM | POA: Diagnosis not present

## 2021-06-12 DIAGNOSIS — J984 Other disorders of lung: Secondary | ICD-10-CM | POA: Insufficient documentation

## 2021-06-12 DIAGNOSIS — C3490 Malignant neoplasm of unspecified part of unspecified bronchus or lung: Secondary | ICD-10-CM | POA: Insufficient documentation

## 2021-06-12 LAB — PULMONARY FUNCTION TEST
DL/VA % pred: 79 %
DL/VA: 3.15 ml/min/mmHg/L
DLCO unc % pred: 69 %
DLCO unc: 16.78 ml/min/mmHg
FEF 25-75 Post: 2.44 L/sec
FEF 25-75 Pre: 1.71 L/sec
FEF2575-%Change-Post: 42 %
FEF2575-%Pred-Post: 117 %
FEF2575-%Pred-Pre: 82 %
FEV1-%Change-Post: 7 %
FEV1-%Pred-Post: 87 %
FEV1-%Pred-Pre: 81 %
FEV1-Post: 2.54 L
FEV1-Pre: 2.36 L
FEV1FVC-%Change-Post: 0 %
FEV1FVC-%Pred-Pre: 103 %
FEV6-%Change-Post: 10 %
FEV6-%Pred-Post: 89 %
FEV6-%Pred-Pre: 81 %
FEV6-Post: 3.38 L
FEV6-Pre: 3.05 L
FEV6FVC-%Change-Post: 2 %
FEV6FVC-%Pred-Post: 106 %
FEV6FVC-%Pred-Pre: 103 %
FVC-%Change-Post: 7 %
FVC-%Pred-Post: 84 %
FVC-%Pred-Pre: 78 %
FVC-Post: 3.4 L
FVC-Pre: 3.16 L
Post FEV1/FVC ratio: 75 %
Post FEV6/FVC ratio: 99 %
Pre FEV1/FVC ratio: 75 %
Pre FEV6/FVC Ratio: 97 %
RV % pred: 119 %
RV: 3.01 L
TLC % pred: 97 %
TLC: 6.65 L

## 2021-06-12 MED ORDER — ALBUTEROL SULFATE (2.5 MG/3ML) 0.083% IN NEBU
2.5000 mg | INHALATION_SOLUTION | Freq: Once | RESPIRATORY_TRACT | Status: DC
Start: 2021-06-12 — End: 2021-06-13

## 2021-06-12 MED ORDER — ALBUTEROL SULFATE (2.5 MG/3ML) 0.083% IN NEBU
2.5000 mg | INHALATION_SOLUTION | Freq: Once | RESPIRATORY_TRACT | Status: DC
  Administered 2021-06-12: 2.5 mg via RESPIRATORY_TRACT

## 2021-06-24 ENCOUNTER — Ambulatory Visit (HOSPITAL_COMMUNITY)
Admission: RE | Admit: 2021-06-24 | Discharge: 2021-06-24 | Disposition: A | Discharge status: Home / Self Care | Payer: Medicare PPO | Source: Ambulatory Visit | Attending: Hematology | Admitting: Hematology

## 2021-06-24 DIAGNOSIS — C3490 Malignant neoplasm of unspecified part of unspecified bronchus or lung: Secondary | ICD-10-CM | POA: Insufficient documentation

## 2021-06-24 IMAGING — MR MR HEAD WO/W CM
15 of 17 series · 32 of 48 positions shown · IV contrast (gadavist)
Comparison: Prior MRI brain. Correlation is made with MRI face
[DATE]

CLINICAL DATA: Non-small cell lung cancer, staging

EXAM:
MRI HEAD WITHOUT AND WITH CONTRAST
TECHNIQUE: Multiplanar, multiecho pulse sequences of the brain and surrounding
structures were obtained without and with intravenous contrast.
CONTRAST:  10mL GADAVIST GADOBUTROL 1 MMOL/ML IV SOLN

[Series 5: DWI · axial · 3.0mm · 0.77mm/px · z∈[-53,+88]mm · 2 of 48 slices shown (1 of 6)]
[im 1/48]
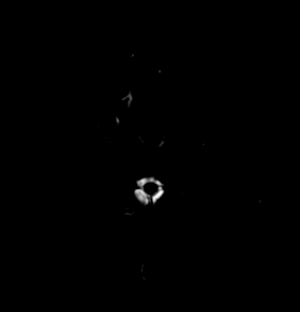
[im 48/48]
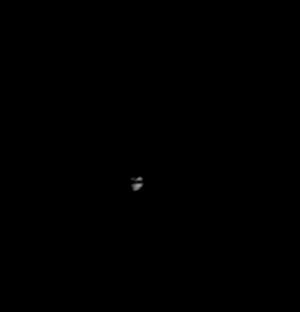

[Series 5: DWI · axial · 3.0mm · 0.77mm/px · z∈[-53,+88]mm · 2 of 48 slices shown (2 of 6)]
[im 1/48]
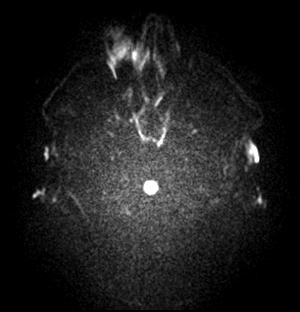
[im 48/48]
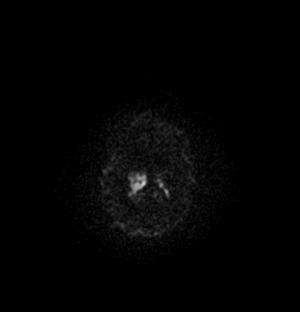

[Series 6: DWI · axial · 3.0mm · 0.77mm/px · z∈[-53,+88]mm · 3 of 48 slices shown (3 of 6)]
[im 1/48]
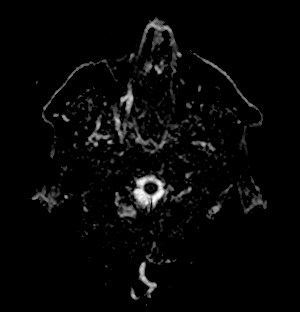
[im 24/48]
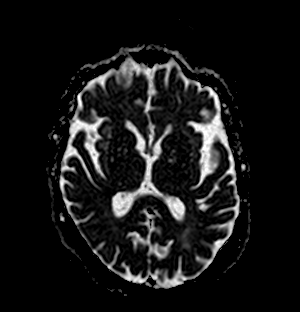
[im 48/48]
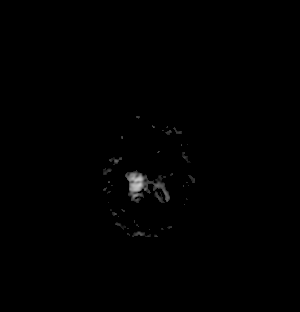

[Series 7: DWI · coronal · 5.0mm · 0.88mm/px · 2 of 31 slices shown (4 of 6)]
[im 1/31]
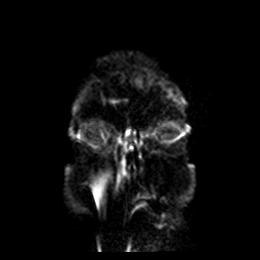
[im 31/31]
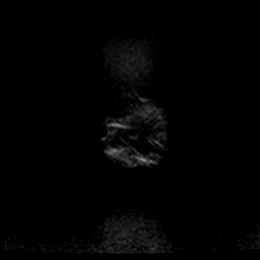

[Series 7: DWI · coronal · 5.0mm · 0.88mm/px · 2 of 31 slices shown (5 of 6)]
[im 1/31]
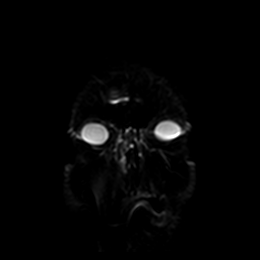
[im 31/31]
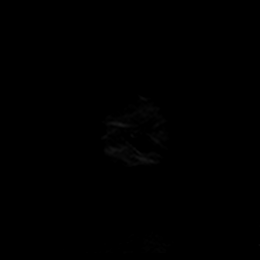

[Series 8: DWI · coronal · 5.0mm · 0.88mm/px · 2 of 31 slices shown (6 of 6)]
[im 1/31]
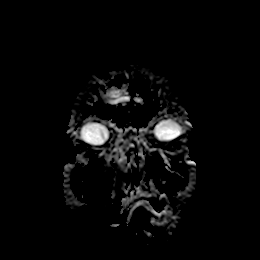
[im 31/31]
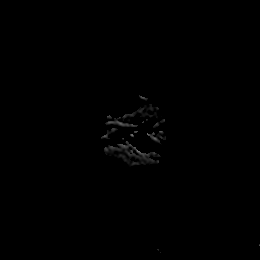

[Series 9: T1 · sagittal · 5.0mm · 0.75mm/px · 1 of 21 slices shown]
[im 1/21]
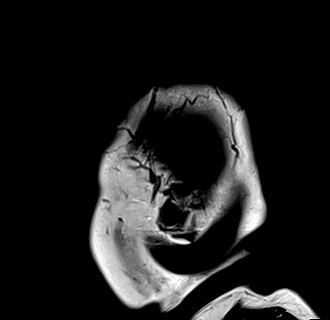

[Series 10: T2 · axial · 5.0mm · 0.72mm/px · 1 of 22 slices shown]
[im 1/22]
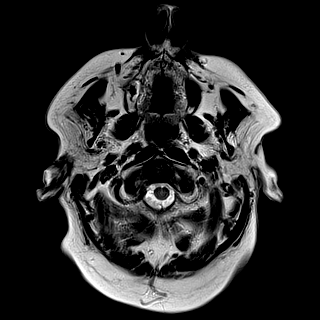

[Series 11: mag_images · axial · 3.0mm · 0.90mm/px · z∈[-59,+93]mm · 3 of 52 slices shown]
[im 1/52]
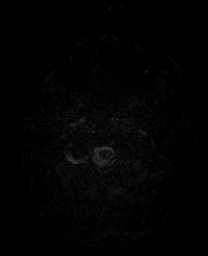
[im 26/52]
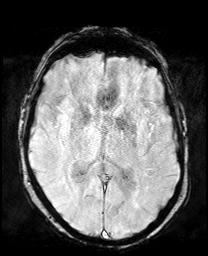
[im 52/52]
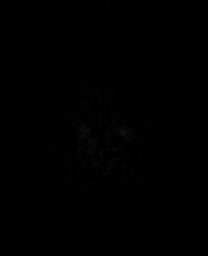

[Series 12: pha_images · axial · 3.0mm · 0.90mm/px · z∈[-59,+93]mm · 3 of 52 slices shown]
[im 1/52]
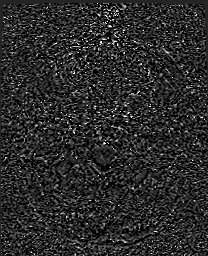
[im 26/52]
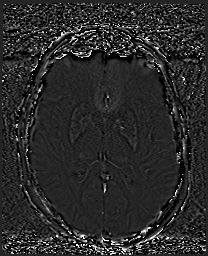
[im 52/52]
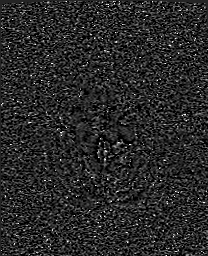

[Series 13: swi_images · axial · 3.0mm · 0.90mm/px · z∈[-59,+93]mm · 3 of 52 slices shown]
[im 1/52]
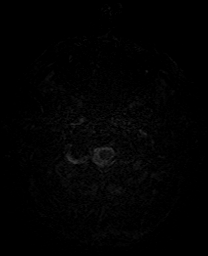
[im 26/52]
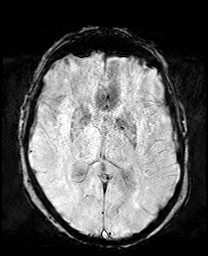
[im 52/52]
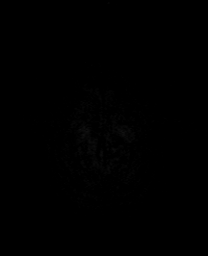

[Series 15: FLAIR · axial · 3.0mm · 0.45mm/px · z∈[-56,+90]mm · 3 of 50 slices shown]
[im 1/50]
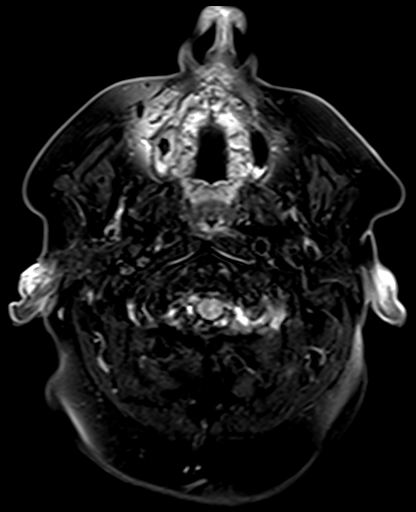
[im 25/50]
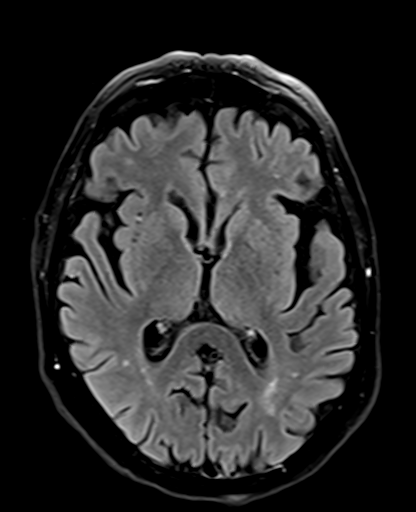
[im 50/50]
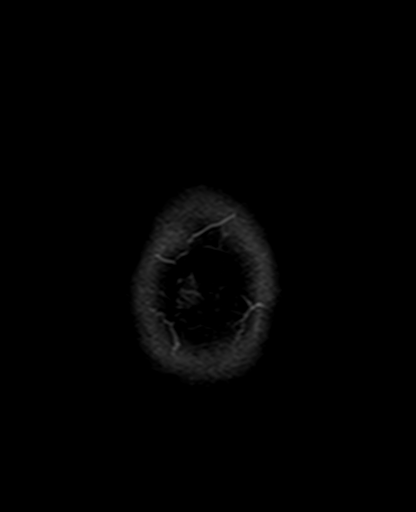

[Series 17: T2 post-contrast · coronal · 5.0mm · 0.72mm/px · 2 of 31 slices shown]
[im 1/31]
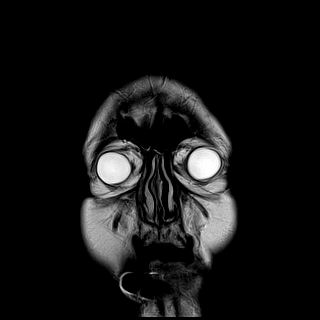
[im 31/31]
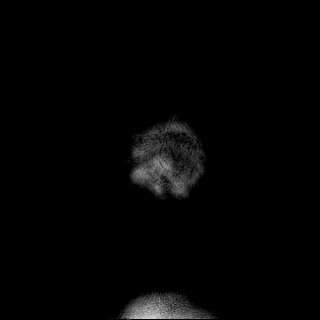

[Series 19: T1 post-contrast · coronal · 5.0mm · 0.34mm/px · 2 of 28 slices shown (1 of 2)]
[im 1/28]
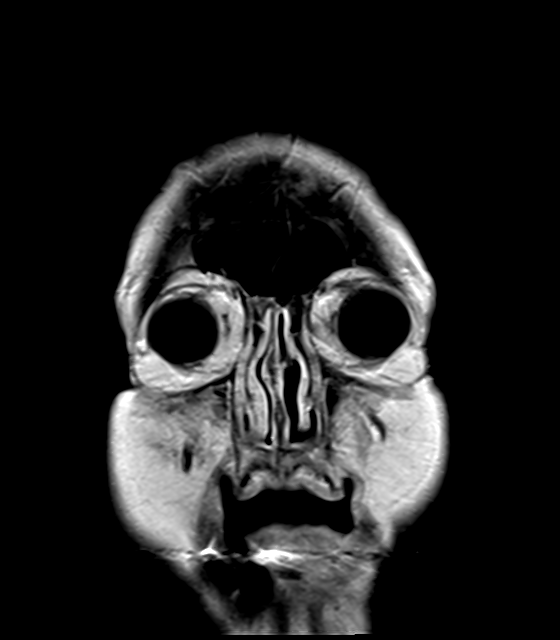
[im 28/28]
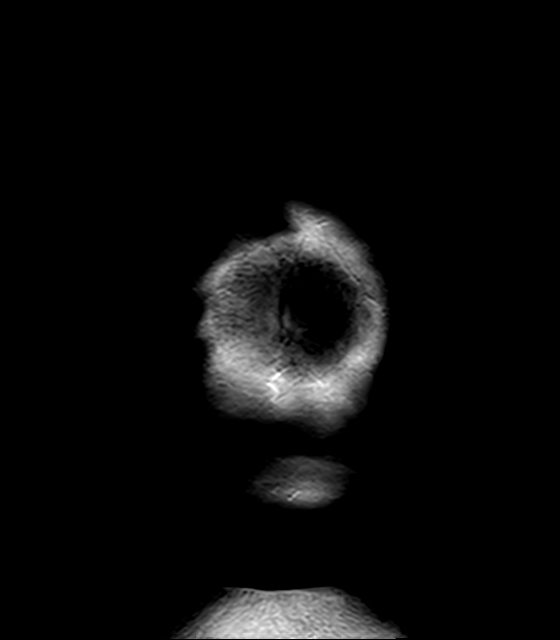

[Series 20: T1 post-contrast · sagittal · 5.0mm · 0.72mm/px · 1 of 21 slices shown (2 of 2)]
[im 1/21]
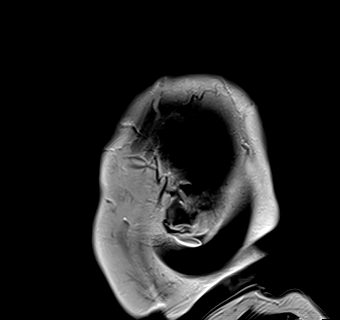

[32 of 48 positions shown; findings below may reference images not displayed]

FINDINGS: Brain: No restricted diffusion to suggest acute or subacute infarct.
No acute hemorrhage, mass, mass effect, or midline shift. No
hydrocephalus or extra-axial collection. No abnormal parenchymal or
meningeal enhancement. Scattered T2 hyperintense signal in the
periventricular white matter, likely the sequela of chronic small
vessel ischemic disease.

Vascular: Normal flow voids.

Skull and upper cervical spine: Normal marrow signal.

Sinuses/Orbits: Minimal mucosal thickening in the right maxillary
sinus. Status post bilateral lens replacements.

Other: The mastoids are well aerated.
IMPRESSION: No acute intracranial process. No evidence of metastatic disease in
the brain.

## 2021-06-24 MED ORDER — GADOBUTROL 1 MMOL/ML IV SOLN
10.0000 mL | Freq: Once | INTRAVENOUS | Status: AC | PRN
  Administered 2021-06-24: 10 mL via INTRAVENOUS

## 2021-06-29 MED ORDER — LIDOCAINE HCL (PF) 2 % IJ SOLN
INTRAMUSCULAR | Status: AC
  Filled 2021-06-29: qty 10

## 2021-06-30 ENCOUNTER — Inpatient Hospital Stay (HOSPITAL_COMMUNITY): Payer: Medicare PPO | Attending: Hematology | Admitting: Hematology

## 2021-06-30 DIAGNOSIS — C3492 Malignant neoplasm of unspecified part of left bronchus or lung: Secondary | ICD-10-CM | POA: Diagnosis not present

## 2021-06-30 NOTE — Progress Notes (Signed)
Virtual Visit via Telephone Note ? ?I connected with Sean Massey on 06/30/21 at  1:15 PM EDT by telephone and verified that I am speaking with the correct person using two identifiers. ? ?Location: ?Patient: At home ?Provider: In office ?  ?I discussed the limitations, risks, security and privacy concerns of performing an evaluation and management service by telephone and the availability of in person appointments. I also discussed with the patient that there may be a patient responsible charge related to this service. The patient expressed understanding and agreed to proceed. ? ? ?History of Present Illness: ?He was evaluated in our clinic for incidental finding of left upper lobe spiculated mass when a CT scan of the neck was done during recent hospitalization in mid February.  Subsequent PET scan on 06/04/2021 showed 19 mm left upper lobe lung lesion with SUV 4.32 adjacent to the major fissure.  No enlarged adenopathy or metastatic disease. ?  ?Observations/Objective: ?He reports that he has been eating well.  Recently he has lost 10 pounds due to abscess in his jaw preventing him from eating. ? ?Assessment and Plan: ? ?1.  Left upper lobe bronchogenic carcinoma, stage I (T1CN0): ?- We have reviewed MRI of the brain which was negative for metastatic disease. ?- PSA was normal. ?- He has completed PFTs. ?- I have recommended consultation with Dr. [name] for bronchoscopy/biopsy/surgical resection of the lung lesion. ?- Plan to see him back 1 month after surgery. ? ? ? ?Follow Up Instructions: ?RTC 2 months. ?  ?I discussed the assessment and treatment plan with the patient. The patient was provided an opportunity to ask questions and all were answered. The patient agreed with the plan and demonstrated an understanding of the instructions. ?  ?The patient was advised to call back or seek an in-person evaluation if the symptoms worsen or if the condition fails to improve as anticipated. ? ?I provided 21 minutes  of non-face-to-face time during this encounter. ? ? ?Derek Jack, MD ? ? ?

## 2021-07-13 ENCOUNTER — Encounter: Payer: Self-pay | Admitting: Thoracic Surgery (Cardiothoracic Vascular Surgery)

## 2021-07-13 ENCOUNTER — Institutional Professional Consult (permissible substitution): Payer: Medicare PPO | Admitting: Thoracic Surgery (Cardiothoracic Vascular Surgery)

## 2021-07-13 VITALS — BP 156/94 | HR 77 | Resp 20 | Ht 69.0 in | Wt 226.0 lb

## 2021-07-13 DIAGNOSIS — R918 Other nonspecific abnormal finding of lung field: Secondary | ICD-10-CM
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
## 2021-07-14 ENCOUNTER — Other Ambulatory Visit: Payer: Self-pay | Admitting: *Deleted

## 2021-07-14 ENCOUNTER — Encounter: Payer: Self-pay | Admitting: *Deleted

## 2021-07-14 DIAGNOSIS — R918 Other nonspecific abnormal finding of lung field: Secondary | ICD-10-CM

## 2021-07-15 ENCOUNTER — Encounter: Payer: Self-pay | Admitting: *Deleted

## 2021-07-20 NOTE — Progress Notes (Signed)
Surgical Instructions ? ? ? Your procedure is scheduled on Friday, April 29th, 2023. ? ? Report to [facility] Main Entrance "A" at 09:30 A.M., then check in with the Admitting office. ? Call this number if you have problems the morning of surgery: ? [phone] ? ? If you have any questions prior to your surgery date call [phone]: Open Monday-Friday 8am-4pm ? ? ? Remember: ? Do not eat or drink after midnight the night before your surgery ?  ? Take these medicines the morning of surgery with A SIP OF WATER:  ? ?fluticasone (FLONASE) ?fluticasone (FLOVENT HFA) ?gabapentin (NEURONTIN)  ?pantoprazole (PROTONIX) ?verapamil (CALAN-SR) ? ?If needed: ? ?albuterol (VENTOLIN HFA) ?ALPRAZolam Duanne Moron) ?hyoscyamine (LEVSIN) ?loperamide (IMODIUM A-D) ? ?Please bring all inhalers with you the day of surgery.   ? ?Follow your surgeon's instructions on when to stop Aspirin.  If no instructions were given by your surgeon then you will need to call the office to get those instructions.    ? ?As of today, STOP taking any Aspirin (unless otherwise instructed by your surgeon) Aleve, Naproxen, Ibuprofen, Motrin, Advil, Goody's, BC's, all herbal medications, fish oil, and all vitamins. ? ? ? The day of surgery: ?         ?Do not wear jewelry  ?Do not wear lotions, powders, colognes, or deodorant. ?Men may shave face and neck. ?Do not bring valuables to the hospital. ? ? ?[facility] is not responsible for any belongings or valuables. .  ? ?Do NOT Smoke (Tobacco/Vaping)  24 hours prior to your procedure ? ?If you use a CPAP at night, you may bring your mask for your overnight stay. ?  ?Contacts, glasses, hearing aids, dentures or partials may not be worn into surgery, please bring cases for these belongings ?  ?For patients admitted to the hospital, discharge time will be determined by your treatment team. ?  ?Patients discharged the day of surgery will not be allowed to drive home, and someone needs to stay with them for 24  hours. ? ? ?SURGICAL WAITING ROOM VISITATION ?Patients having surgery or a procedure in a hospital may have two support people. ?Children under the age of 41 must have an adult with them who is not the patient. ?They may stay in the waiting area during the procedure and may switch out with other visitors. If the patient needs to stay at the hospital during part of their recovery, the visitor guidelines for inpatient rooms apply. ? ?Please refer to the [facility] website for the visitor guidelines for Inpatients (after your surgery is over and you are in a regular room).  ? ? ?Special instructions:   ? ?Oral Hygiene is also important to reduce your risk of infection.  Remember - BRUSH YOUR TEETH THE MORNING OF SURGERY WITH YOUR REGULAR TOOTHPASTE ? ? ?[facility] ? ?Before surgery, you can play an important role. Because skin is not sterile, your skin needs to be as free of germs as possible. You can reduce the number of germs on your skin by washing with CHG (chlorahexidine gluconate) Soap before surgery.  CHG is an antiseptic cleaner which kills germs and bonds with the skin to continue killing germs even after washing.   ? ? ?Please do not use if you have an allergy to CHG or antibacterial soaps. If your skin becomes reddened/irritated stop using the CHG.  ?Do not shave (including legs and underarms) for at least 48 hours prior to first CHG shower. It  is OK to shave your face. ? ?Please follow these instructions carefully. ?  ? ? Shower the NIGHT BEFORE SURGERY and the MORNING OF SURGERY with CHG Soap.  ? If you chose to wash your hair, wash your hair first as usual with your normal shampoo. After you shampoo, rinse your hair and body thoroughly to remove the shampoo.  Then [employer] and genitals (private parts) with your normal soap and rinse thoroughly to remove soap. ? ?After that Use CHG Soap as you would any other liquid soap. You can apply CHG directly to the skin and wash gently with  a scrungie or a clean washcloth.  ? ?Apply the CHG Soap to your body ONLY FROM THE NECK DOWN.  Do not use on open wounds or open sores. Avoid contact with your eyes, ears, mouth and genitals (private parts). Wash Face and genitals (private parts)  with your normal soap.  ? ?Wash thoroughly, paying special attention to the area where your surgery will be performed. ? ?Thoroughly rinse your body with warm water from the neck down. ? ?DO NOT shower/wash with your normal soap after using and rinsing off the CHG Soap. ? ?Pat yourself dry with a CLEAN TOWEL. ? ?Wear CLEAN PAJAMAS to bed the night before surgery ? ?Place CLEAN SHEETS on your bed the night before your surgery ? ?DO NOT SLEEP WITH PETS. ? ? ?Day of Surgery: ? ?Take a shower with CHG soap. ?Wear Clean/Comfortable clothing the morning of surgery ?Do not apply any deodorants/lotions.   ?Remember to brush your teeth WITH YOUR REGULAR TOOTHPASTE. ? ? ? ?If you received a COVID test during your pre-op visit, it is requested that you wear a mask when out in public, stay away from anyone that may not be feeling well, and notify your surgeon if you develop symptoms. If you have been in contact with anyone that has tested positive in the last 10 days, please notify your surgeon. ? ?  ?Please read over the following fact sheets that you were given.   ?

## 2021-07-21 ENCOUNTER — Ambulatory Visit (HOSPITAL_COMMUNITY)
Admission: RE | Admit: 2021-07-21 | Discharge: 2021-07-21 | Disposition: A | Discharge status: Home / Self Care | Payer: Medicare PPO | Source: Ambulatory Visit | Attending: Thoracic Surgery (Cardiothoracic Vascular Surgery) | Admitting: Thoracic Surgery (Cardiothoracic Vascular Surgery)

## 2021-07-21 ENCOUNTER — Other Ambulatory Visit: Payer: Self-pay

## 2021-07-21 ENCOUNTER — Encounter (HOSPITAL_COMMUNITY): Payer: Self-pay

## 2021-07-21 ENCOUNTER — Encounter (HOSPITAL_COMMUNITY)
Admission: RE | Admit: 2021-07-21 | Discharge: 2021-07-21 | Disposition: A | Discharge status: Home / Self Care | Payer: Medicare PPO | Source: Ambulatory Visit | Attending: Thoracic Surgery (Cardiothoracic Vascular Surgery) | Admitting: Thoracic Surgery (Cardiothoracic Vascular Surgery)

## 2021-07-21 ENCOUNTER — Ambulatory Visit: Payer: Medicare PPO | Admitting: Urology

## 2021-07-21 VITALS — BP 152/91 | HR 70 | Temp 98.7°F | Resp 18 | Ht 69.0 in | Wt 228.7 lb

## 2021-07-21 DIAGNOSIS — R918 Other nonspecific abnormal finding of lung field: Secondary | ICD-10-CM

## 2021-07-21 DIAGNOSIS — Z01818 Encounter for other preprocedural examination: Secondary | ICD-10-CM

## 2021-07-21 HISTORY — DX: Polyneuropathy, unspecified: G62.9

## 2021-07-21 LAB — URINALYSIS, ROUTINE W REFLEX MICROSCOPIC
Bilirubin Urine: NEGATIVE
Glucose, UA: NEGATIVE mg/dL
Hgb urine dipstick: NEGATIVE
Ketones, ur: NEGATIVE mg/dL
Leukocytes,Ua: NEGATIVE
Nitrite: NEGATIVE
Protein, ur: NEGATIVE mg/dL
Specific Gravity, Urine: 1.013 (ref 1.005–1.030)
pH: 5 (ref 5.0–8.0)

## 2021-07-21 LAB — CBC
HCT: 32.3 % — ABNORMAL LOW (ref 39.0–52.0)
Hemoglobin: 10.3 g/dL — ABNORMAL LOW (ref 13.0–17.0)
MCH: 29.9 pg (ref 26.0–34.0)
MCHC: 31.9 g/dL (ref 30.0–36.0)
MCV: 93.9 fL (ref 80.0–100.0)
Platelets: 319 10*3/uL (ref 150–400)
RBC: 3.44 MIL/uL — ABNORMAL LOW (ref 4.22–5.81)
RDW: 17.3 % — ABNORMAL HIGH (ref 11.5–15.5)
WBC: 5.4 10*3/uL (ref 4.0–10.5)
nRBC: 0 % (ref 0.0–0.2)

## 2021-07-21 LAB — TYPE AND SCREEN
ABO/RH(D): O POS
Antibody Screen: NEGATIVE

## 2021-07-21 LAB — SURGICAL PCR SCREEN
MRSA, PCR: NEGATIVE
Staphylococcus aureus: POSITIVE — AB

## 2021-07-21 LAB — BLOOD GAS, ARTERIAL
Acid-Base Excess: 3.2 mmol/L — ABNORMAL HIGH (ref 0.0–2.0)
Bicarbonate: 27.9 mmol/L (ref 20.0–28.0)
Drawn by: 58793
O2 Saturation: 98.4 %
Patient temperature: 37
pCO2 arterial: 42 mmHg (ref 32–48)
pH, Arterial: 7.43 (ref 7.35–7.45)
pO2, Arterial: 90 mmHg (ref 83–108)

## 2021-07-21 LAB — COMPREHENSIVE METABOLIC PANEL
ALT: 19 U/L (ref 0–44)
AST: 31 U/L (ref 15–41)
Albumin: 3.8 g/dL (ref 3.5–5.0)
Alkaline Phosphatase: 58 U/L (ref 38–126)
Anion gap: 10 (ref 5–15)
BUN: 12 mg/dL (ref 8–23)
CO2: 25 mmol/L (ref 22–32)
Calcium: 10.5 mg/dL — ABNORMAL HIGH (ref 8.9–10.3)
Chloride: 103 mmol/L (ref 98–111)
Creatinine, Ser: 1.4 mg/dL — ABNORMAL HIGH (ref 0.61–1.24)
GFR, Estimated: 52 mL/min — ABNORMAL LOW (ref >60)
Glucose, Bld: 95 mg/dL (ref 70–99)
Potassium: 4 mmol/L (ref 3.5–5.1)
Sodium: 138 mmol/L (ref 135–145)
Total Bilirubin: 0.6 mg/dL (ref 0.3–1.2)
Total Protein: 7 g/dL (ref 6.5–8.1)

## 2021-07-21 LAB — APTT: aPTT: 33 seconds (ref 24–36)

## 2021-07-21 LAB — SARS CORONAVIRUS 2 (TAT 6-24 HRS): SARS Coronavirus 2: NEGATIVE

## 2021-07-21 LAB — PROTIME-INR
INR: 1 (ref 0.8–1.2)
Prothrombin Time: 13.3 seconds (ref 11.4–15.2)

## 2021-07-21 IMAGING — DX DG CHEST 2V
2 series · 2 of 2 positions shown · non-contrast
Comparison: PET-CT [DATE].  Chest x-ray [DATE].

CLINICAL DATA: Preoperative study.

EXAM:
CHEST - 2 VIEW

[w chest pa]
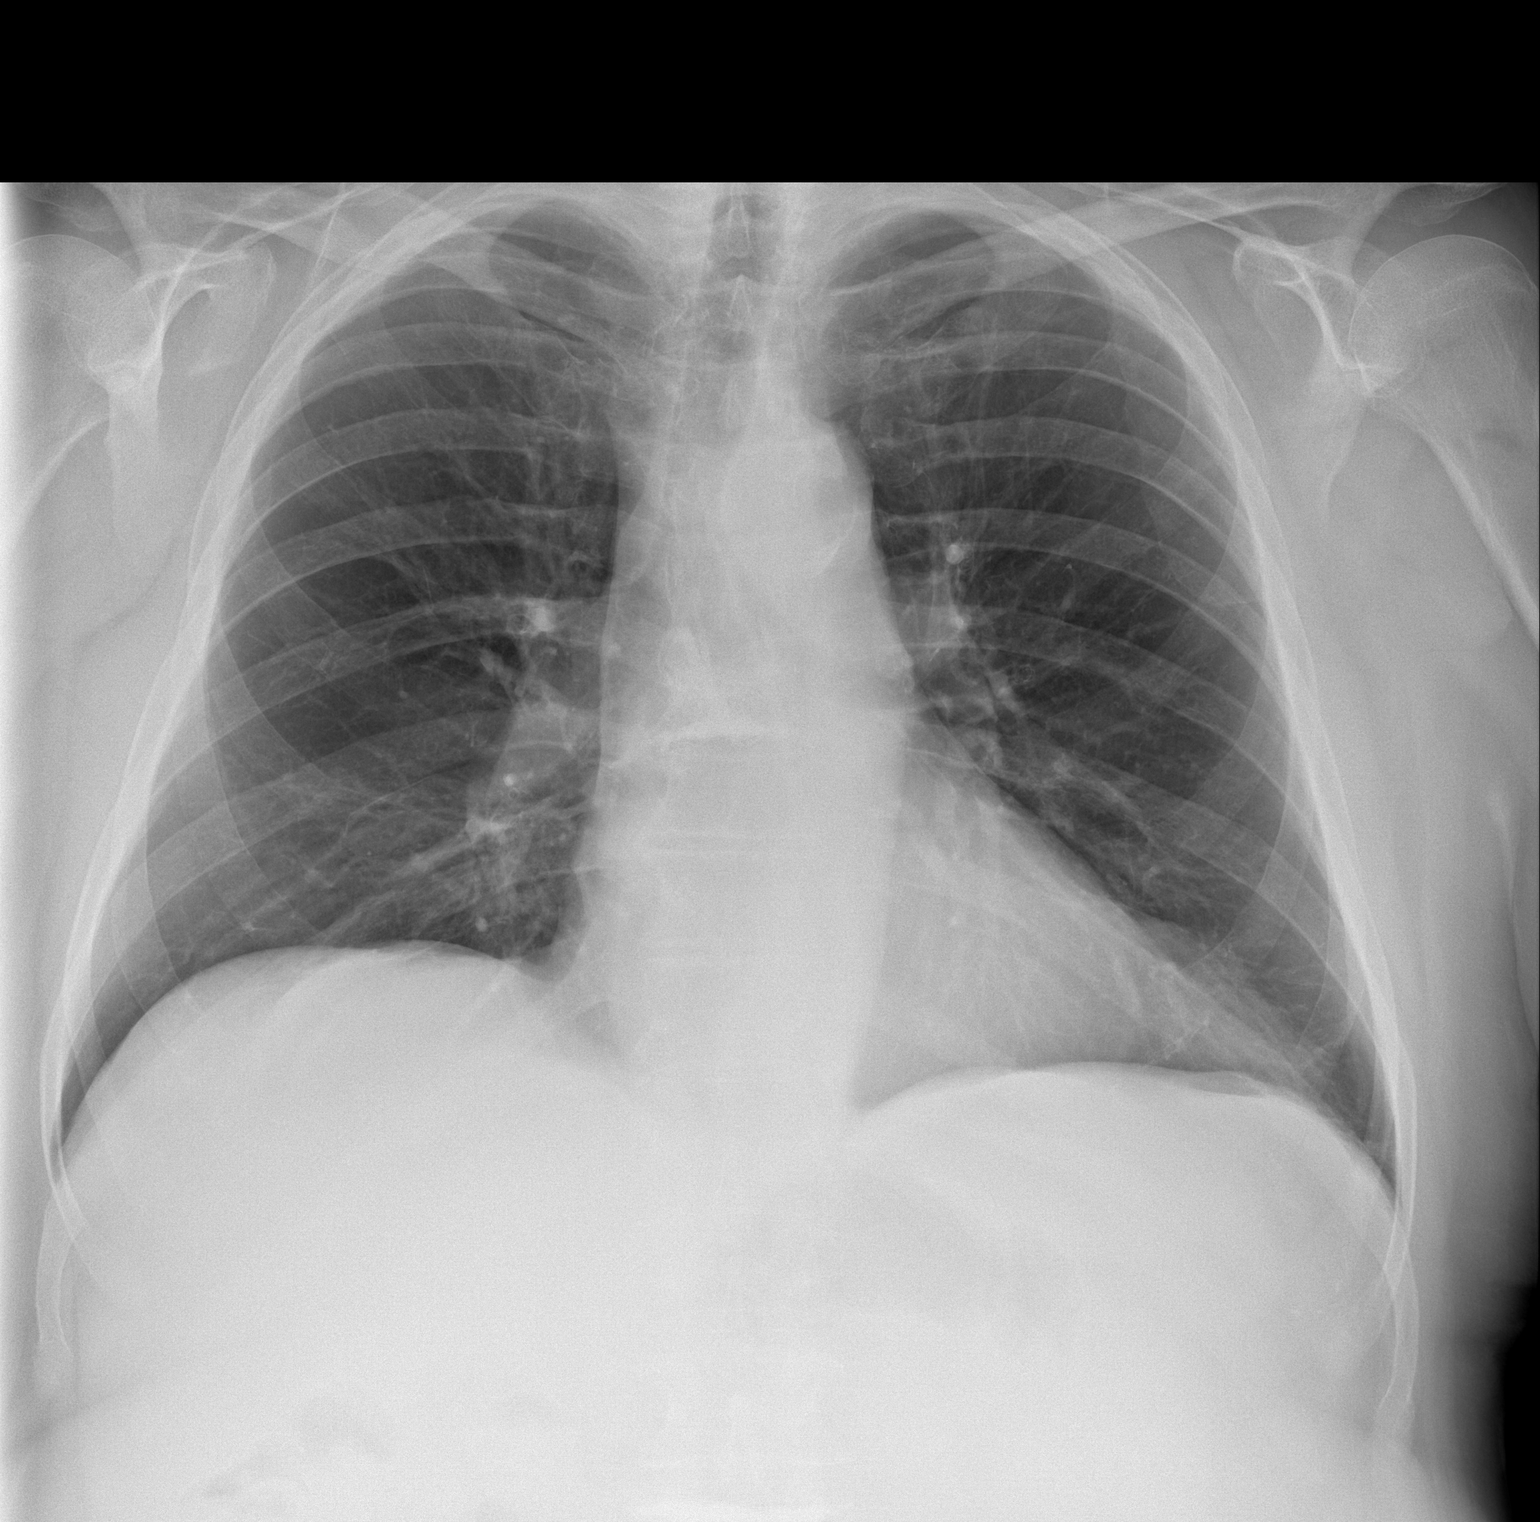

[w chest lat]
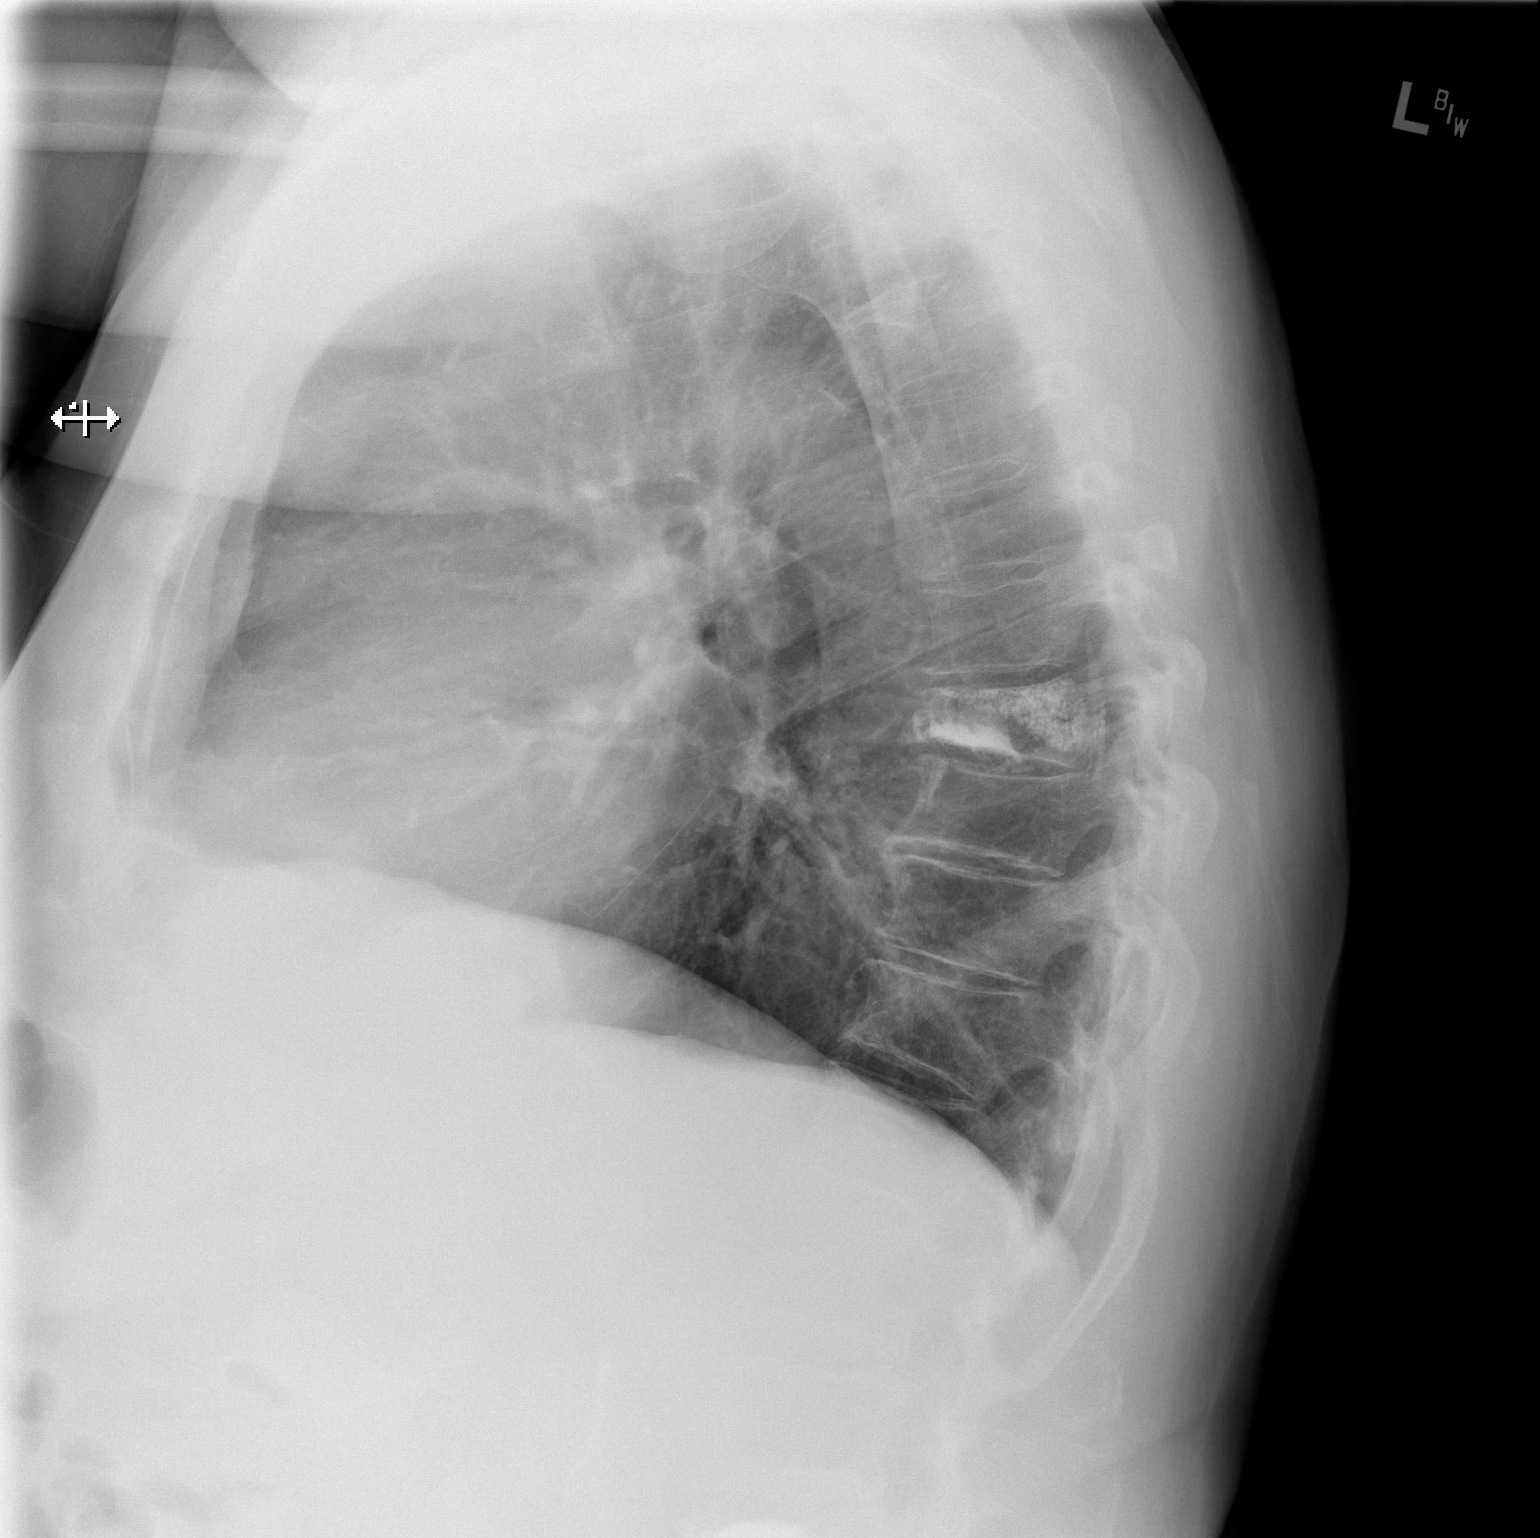

[2 of 2 positions shown; findings below may reference images not displayed]

FINDINGS: The patient's known left upper lobe neoplasm is not well visualized
on either today's chest x-ray or the previous chest x-ray. The
finding is very subtle on x-ray imaging. The patient is status post
vertebroplasty of a midthoracic vertebral body. Visualized bones are
otherwise unremarkable. The cardiomediastinal silhouette is stable.
No pneumothorax. No other acute abnormalities.
IMPRESSION: 1. The patient's known left upper lobe neoplasm is not well
visualized on today's chest x-ray. The finding is very subtle on
today's imaging.
2. No other abnormalities identified.

## 2021-07-21 NOTE — Progress Notes (Signed)
.  PCP - Sherrilee Gilles, DO ?Cardiologist -  ?Oncologist -  Derek Jack, MD ? ?PPM/ICD - denies ?Device Orders - n/a ?Rep Notified - n/a ? ?Chest x-ray - 07/21/2021 ?EKG - 07/21/2021 ?Stress Test - denies ?ECHO - denies ?Cardiac Cath - denies ? ?Sleep Study - denies ?CPAP - n/a ? ?Fasting Blood Sugar - n/a ? ?Blood Thinner Instructions: n/a ? ?Aspirin Instructions: Patient was instructed to call MD and ask if he must hold Aspirin prior surgery. Patient verbalized understanding. ? ?Patient was instructed: As of today, STOP taking any Aspirin (unless otherwise instructed by your surgeon) Aleve, Naproxen, Ibuprofen, Motrin, Advil, Goody's, BC's, all herbal medications, fish oil, and all vitamins. ? ?ERAS Protcol - n/a ? ?COVID TEST- yes - done in PAT on 07/21/2021 ? ? ?Anesthesia review: yes - abnormal EKG in PAT ? ?Patient denies shortness of breath, fever, cough and chest pain at PAT appointment ? ? ?All instructions explained to the patient, with a verbal understanding of the material. Patient agrees to go over the instructions while at home for a better understanding. Patient also instructed to self quarantine after being tested for COVID-19. The opportunity to ask questions was provided. ?  ?

## 2021-07-22 ENCOUNTER — Other Ambulatory Visit (HOSPITAL_COMMUNITY): Payer: Medicare PPO
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
## 2021-07-22 NOTE — Anesthesia Preprocedure Evaluation (Addendum)
Anesthesia Evaluation  ?Patient identified by MRN, date of birth, ID band ?Patient awake ? ? ? ?Reviewed: ?Allergy & Precautions, NPO status , Patient's Chart, lab work & pertinent test results ? ?Airway ?Mallampati: II ? ?TM Distance: >3 FB ?Neck ROM: Full ? ? ? Dental ?no notable dental hx. ? ?  ?Pulmonary ?asthma , former smoker,  ?  ?Pulmonary exam normal ? ? ? ? ? ? ? Cardiovascular ?hypertension, + dysrhythmias  ?Rhythm:Regular Rate:Normal ? ? ?  ?Neuro/Psych ?Anxiety negative neurological ROS ?   ? GI/Hepatic ?Neg liver ROS, PUD, GERD  Medicated,  ?Endo/Other  ?negative endocrine ROS ? Renal/GU ?  ?negative genitourinary ?  ?Musculoskeletal ? ?(+) Arthritis , Osteoarthritis,   ? Abdominal ?Normal abdominal exam  (+)   ?Peds ? Hematology ? ?(+) Blood dyscrasia, anemia , Lab Results ?     Component                Value               Date                 ?     WBC                      5.4                 07/21/2021           ?     HGB                      10.3 (L)            07/21/2021           ?     HCT                      32.3 (L)            07/21/2021           ?     MCV                      93.9                07/21/2021           ?     PLT                      319                 07/21/2021           ?Lab Results ?     Component                Value               Date                 ?     NA                       138                 07/21/2021           ?     K  4.0                 07/21/2021           ?     CO2                      25                  07/21/2021           ?     GLUCOSE                  95                  07/21/2021           ?     BUN                      12                  07/21/2021           ?     CREATININE               1.40 (H)            07/21/2021           ?     CALCIUM                  10.5 (H)            07/21/2021           ?     GFRNONAA                 52 (L)              07/21/2021             ?Anesthesia Other  Findings ? ? Reproductive/Obstetrics ? ?  ? ? ? ? ? ? ? ? ? ? ? ? ? ?  ?  ? ? ? ? ? ? ? ?Anesthesia Physical ?Anesthesia Plan ? ?ASA: 3 ? ?Anesthesia Plan: General  ? ?Post-op Pain Management:   ? ?Induction: Intravenous ? ?PONV Risk Score and Plan: 2 and Ondansetron, Dexamethasone and Treatment may vary due to age or medical condition ? ?Airway Management Planned: Mask and Oral ETT ? ?Additional Equipment: Arterial line ? ?Intra-op Plan:  ? ?Post-operative Plan:  ? ?Informed Consent: I have reviewed the patients History and Physical, chart, labs and discussed the procedure including the risks, benefits and alternatives for the proposed anesthesia with the patient or authorized representative who has indicated his/her understanding and acceptance.  ? ? ? ?Dental advisory given ? ?Plan Discussed with: CRNA ? ?Anesthesia Plan Comments: (PAT note written 07/22/2021 by Myra Gianotti, PA-C. ?CV:  ?Per 01/09/19 cardiology note by Dr. Darral Dash (scanned under Media tab, Correspondence): ?? ?- "TTE 12/27/2018: EF 50-55% visually, 52% by BPS, PSM (LBBB), much improved E/A separation at HR 55-60 bpm compared to prior studies, normal LVFP, normal RV function, normal RAP, structurally and functionally normal valves" ?? ?- "DHV, R+LHC 09/04/2009: LM N, LAD 25, LCX 25-50, RCA 25, normal renals, RA 5, RV 29/5, WP 8, PA 29.8"        ??)  ? ? ? ? ? ?Anesthesia Quick Evaluation ? ?

## 2021-07-23 MED ORDER — VANCOMYCIN HCL 1500 MG/300ML IV SOLN
1500.0000 mg | INTRAVENOUS | Status: AC
  Administered 2021-07-24: 1500 mg via INTRAVENOUS
  Filled 2021-07-23 (×2): qty 300

## 2021-07-24 ENCOUNTER — Encounter (HOSPITAL_COMMUNITY)
Admission: RE | Disposition: A | Discharge status: Home / Self Care | Payer: Self-pay | Source: Home / Self Care | Attending: Thoracic Surgery (Cardiothoracic Vascular Surgery)

## 2021-07-24 ENCOUNTER — Other Ambulatory Visit: Payer: Self-pay

## 2021-07-24 ENCOUNTER — Inpatient Hospital Stay (HOSPITAL_COMMUNITY): Payer: Medicare PPO | Admitting: Anesthesiology

## 2021-07-24 ENCOUNTER — Inpatient Hospital Stay (HOSPITAL_COMMUNITY)
Admission: RE | Admit: 2021-07-24 | Discharge: 2021-07-27 | DRG: 164 | Disposition: A | Discharge status: Home / Self Care | Payer: Medicare PPO | Attending: Thoracic Surgery (Cardiothoracic Vascular Surgery) | Admitting: Thoracic Surgery (Cardiothoracic Vascular Surgery)

## 2021-07-24 ENCOUNTER — Inpatient Hospital Stay (HOSPITAL_COMMUNITY): Payer: Medicare PPO | Admitting: Vascular Surgery

## 2021-07-24 ENCOUNTER — Inpatient Hospital Stay (HOSPITAL_COMMUNITY): Payer: Medicare PPO

## 2021-07-24 ENCOUNTER — Encounter (HOSPITAL_COMMUNITY): Payer: Self-pay | Admitting: Thoracic Surgery (Cardiothoracic Vascular Surgery)

## 2021-07-24 DIAGNOSIS — Z8546 Personal history of malignant neoplasm of prostate: Secondary | ICD-10-CM | POA: Diagnosis not present

## 2021-07-24 DIAGNOSIS — Z8042 Family history of malignant neoplasm of prostate: Secondary | ICD-10-CM

## 2021-07-24 DIAGNOSIS — C3412 Malignant neoplasm of upper lobe, left bronchus or lung: Secondary | ICD-10-CM | POA: Diagnosis present

## 2021-07-24 DIAGNOSIS — Z01818 Encounter for other preprocedural examination: Secondary | ICD-10-CM | POA: Diagnosis not present

## 2021-07-24 DIAGNOSIS — K219 Gastro-esophageal reflux disease without esophagitis: Secondary | ICD-10-CM | POA: Diagnosis present

## 2021-07-24 DIAGNOSIS — J45909 Unspecified asthma, uncomplicated: Secondary | ICD-10-CM | POA: Diagnosis present

## 2021-07-24 DIAGNOSIS — Z20822 Contact with and (suspected) exposure to covid-19: Secondary | ICD-10-CM | POA: Diagnosis present

## 2021-07-24 DIAGNOSIS — G4733 Obstructive sleep apnea (adult) (pediatric): Secondary | ICD-10-CM | POA: Diagnosis present

## 2021-07-24 DIAGNOSIS — M1990 Unspecified osteoarthritis, unspecified site: Secondary | ICD-10-CM | POA: Diagnosis present

## 2021-07-24 DIAGNOSIS — Y838 Other surgical procedures as the cause of abnormal reaction of the patient, or of later complication, without mention of misadventure at the time of the procedure: Secondary | ICD-10-CM | POA: Diagnosis not present

## 2021-07-24 DIAGNOSIS — Z8711 Personal history of peptic ulcer disease: Secondary | ICD-10-CM | POA: Diagnosis not present

## 2021-07-24 DIAGNOSIS — G629 Polyneuropathy, unspecified: Secondary | ICD-10-CM | POA: Diagnosis present

## 2021-07-24 DIAGNOSIS — R918 Other nonspecific abnormal finding of lung field: Secondary | ICD-10-CM

## 2021-07-24 DIAGNOSIS — K5190 Ulcerative colitis, unspecified, without complications: Secondary | ICD-10-CM | POA: Diagnosis present

## 2021-07-24 DIAGNOSIS — Z87891 Personal history of nicotine dependence: Secondary | ICD-10-CM | POA: Diagnosis not present

## 2021-07-24 DIAGNOSIS — D62 Acute posthemorrhagic anemia: Secondary | ICD-10-CM | POA: Diagnosis not present

## 2021-07-24 DIAGNOSIS — R911 Solitary pulmonary nodule: Secondary | ICD-10-CM | POA: Diagnosis present

## 2021-07-24 DIAGNOSIS — I10 Essential (primary) hypertension: Secondary | ICD-10-CM | POA: Diagnosis present

## 2021-07-24 DIAGNOSIS — E7800 Pure hypercholesterolemia, unspecified: Secondary | ICD-10-CM | POA: Diagnosis present

## 2021-07-24 DIAGNOSIS — Z87442 Personal history of urinary calculi: Secondary | ICD-10-CM

## 2021-07-24 DIAGNOSIS — Z79899 Other long term (current) drug therapy: Secondary | ICD-10-CM | POA: Diagnosis not present

## 2021-07-24 DIAGNOSIS — Z902 Acquired absence of lung [part of]: Secondary | ICD-10-CM

## 2021-07-24 DIAGNOSIS — J9811 Atelectasis: Secondary | ICD-10-CM | POA: Diagnosis not present

## 2021-07-24 DIAGNOSIS — F419 Anxiety disorder, unspecified: Secondary | ICD-10-CM | POA: Diagnosis not present

## 2021-07-24 DIAGNOSIS — T8182XA Emphysema (subcutaneous) resulting from a procedure, initial encounter: Secondary | ICD-10-CM | POA: Diagnosis not present

## 2021-07-24 DIAGNOSIS — Z7951 Long term (current) use of inhaled steroids: Secondary | ICD-10-CM

## 2021-07-24 DIAGNOSIS — Z7982 Long term (current) use of aspirin: Secondary | ICD-10-CM

## 2021-07-24 HISTORY — PX: NODE DISSECTION: SHX5269

## 2021-07-24 HISTORY — PX: INTERCOSTAL NERVE BLOCK: SHX5021

## 2021-07-24 LAB — ABO/RH: ABO/RH(D): O POS

## 2021-07-24 IMAGING — DX DG CHEST 1V PORT
1 series · 1 of 1 positions shown · non-contrast
Comparison: Chest two views [DATE] PET-CT [DATE]

CLINICAL DATA: Status post lung lobectomy.

EXAM:
PORTABLE CHEST 1 VIEW

[chest ap]
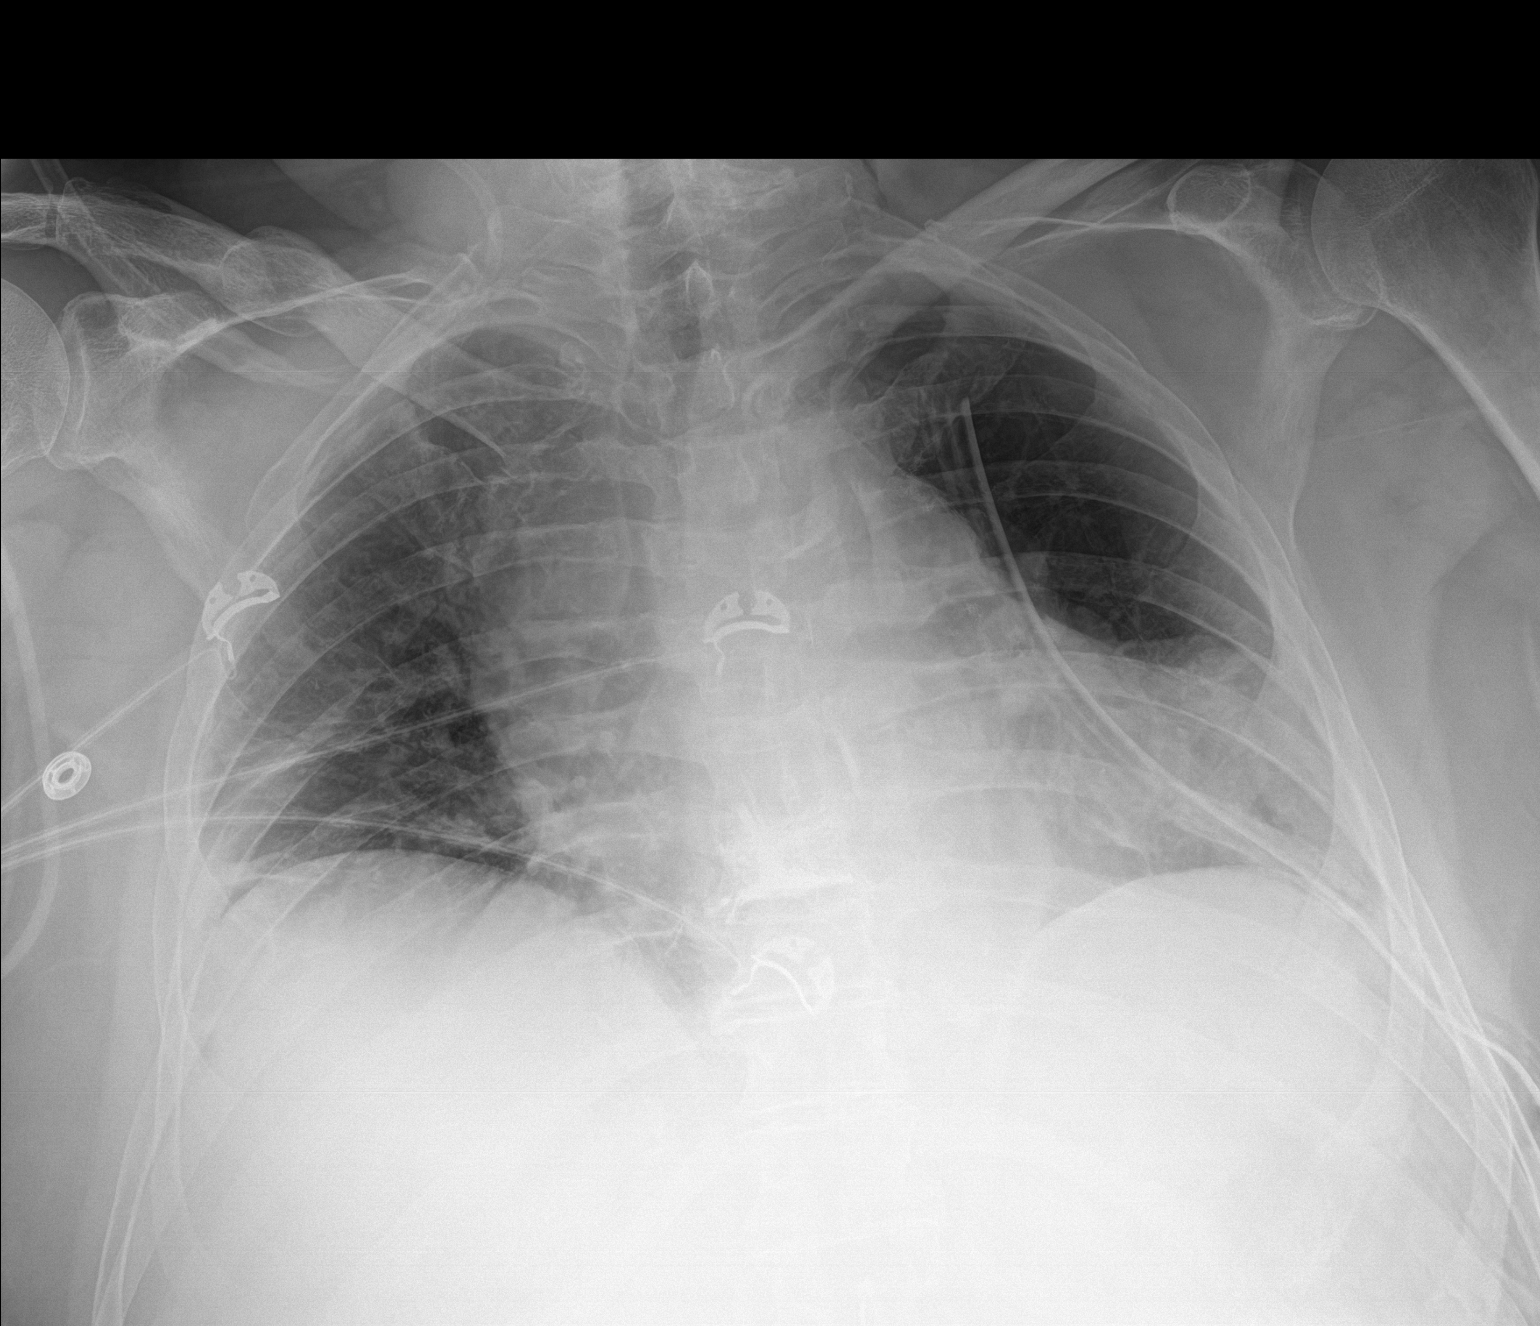

[1 of 1 positions shown; findings below may reference images not displayed]

FINDINGS: Interval left upper lung resection of the previously seen left upper
lobe neoplasm. There is a left-sided chest tube. No definite
left-sided pneumothorax is seen. At the most, there could be a tiny
left apical pneumothorax. There are moderately decreased lung
volumes bilaterally. Moderate bibasilar bronchovascular
crowding/subsegmental atelectasis. Cardiac silhouette and
mediastinal contours are grossly within normal limits given low lung
volumes and AP technique. No definite pleural effusion. No acute
skeletal abnormality.
IMPRESSION: Interval left upper lobectomy with left-sided chest tube in place.
No definite pneumothorax. At the most, there could be a tiny left
apical pneumothorax, not unexpected following the recent surgery.

## 2021-07-24 SURGERY — WEDGE RESECTION, LUNG, ROBOT-ASSISTED, THORACOSCOPIC
Anesthesia: General | Site: Chest | Laterality: Left

## 2021-07-24 MED ORDER — ALPRAZOLAM 0.5 MG PO TABS: 0.5000 mg | ORAL_TABLET | Freq: Every day | ORAL | Status: DC | PRN

## 2021-07-24 MED ORDER — ALBUTEROL SULFATE HFA 108 (90 BASE) MCG/ACT IN AERS: 2.0000 | INHALATION_SPRAY | Freq: Four times a day (QID) | RESPIRATORY_TRACT | Status: DC | PRN

## 2021-07-24 MED ORDER — FENTANYL CITRATE (PF) 100 MCG/2ML IJ SOLN
INTRAMUSCULAR | Status: AC
  Filled 2021-07-24: qty 2

## 2021-07-24 MED ORDER — FLUTICASONE PROPIONATE 50 MCG/ACT NA SUSP
1.0000 | Freq: Every day | NASAL | Status: DC
  Administered 2021-07-26 – 2021-07-27 (×2): 1 via NASAL
  Filled 2021-07-24: qty 16

## 2021-07-24 MED ORDER — ACETAMINOPHEN 160 MG/5ML PO SOLN: 1000.0000 mg | Freq: Four times a day (QID) | ORAL | Status: DC

## 2021-07-24 MED ORDER — LACTATED RINGERS IV SOLN: INTRAVENOUS | Status: DC

## 2021-07-24 MED ORDER — BUDESONIDE 0.5 MG/2ML IN SUSP
0.5000 mg | Freq: Two times a day (BID) | RESPIRATORY_TRACT | Status: DC
  Administered 2021-07-24 – 2021-07-27 (×5): 0.5 mg via RESPIRATORY_TRACT
  Filled 2021-07-24 (×6): qty 2

## 2021-07-24 MED ORDER — LUNG SURGERY BOOK
Freq: Once | Status: AC
  Filled 2021-07-24: qty 1

## 2021-07-24 MED ORDER — ACETAMINOPHEN 10 MG/ML IV SOLN
1000.0000 mg | Freq: Once | INTRAVENOUS | Status: DC | PRN
  Administered 2021-07-24: 1000 mg via INTRAVENOUS

## 2021-07-24 MED ORDER — SODIUM CHLORIDE 0.45 % IV SOLN: INTRAVENOUS | Status: DC

## 2021-07-24 MED ORDER — ORAL CARE MOUTH RINSE: 15.0000 mL | Freq: Once | OROMUCOSAL | Status: AC

## 2021-07-24 MED ORDER — ONDANSETRON HCL 4 MG/2ML IJ SOLN
INTRAMUSCULAR | Status: DC | PRN
  Administered 2021-07-24: 4 mg via INTRAVENOUS

## 2021-07-24 MED ORDER — SENNOSIDES-DOCUSATE SODIUM 8.6-50 MG PO TABS: 1.0000 | ORAL_TABLET | Freq: Every day | ORAL | Status: DC

## 2021-07-24 MED ORDER — ACETAMINOPHEN 10 MG/ML IV SOLN
INTRAVENOUS | Status: AC
  Filled 2021-07-24: qty 100

## 2021-07-24 MED ORDER — ACETAMINOPHEN 500 MG PO TABS
1000.0000 mg | ORAL_TABLET | Freq: Four times a day (QID) | ORAL | Status: DC
  Administered 2021-07-25 – 2021-07-27 (×7): 1000 mg via ORAL
  Filled 2021-07-24 (×7): qty 2

## 2021-07-24 MED ORDER — FENTANYL CITRATE (PF) 250 MCG/5ML IJ SOLN
INTRAMUSCULAR | Status: DC | PRN
  Administered 2021-07-24: 50 ug via INTRAVENOUS
  Administered 2021-07-24: 100 ug via INTRAVENOUS
  Administered 2021-07-24 (×2): 50 ug via INTRAVENOUS

## 2021-07-24 MED ORDER — BISACODYL 5 MG PO TBEC
10.0000 mg | DELAYED_RELEASE_TABLET | Freq: Every day | ORAL | Status: DC
  Administered 2021-07-27: 10 mg via ORAL
  Filled 2021-07-24 (×2): qty 2

## 2021-07-24 MED ORDER — ATORVASTATIN CALCIUM 10 MG PO TABS
10.0000 mg | ORAL_TABLET | Freq: Every day | ORAL | Status: DC
Start: 2021-07-24 — End: 2021-07-27
  Administered 2021-07-24 – 2021-07-26 (×3): 10 mg via ORAL
  Filled 2021-07-24 (×3): qty 1

## 2021-07-24 MED ORDER — LACTATED RINGERS IV SOLN: INTRAVENOUS | Status: DC | PRN

## 2021-07-24 MED ORDER — TOFACITINIB CITRATE 5 MG PO TABS
1.0000 | ORAL_TABLET | Freq: Two times a day (BID) | ORAL | Status: DC
  Administered 2021-07-26 – 2021-07-27 (×2): 5 mg via ORAL
  Filled 2021-07-24 (×3): qty 1

## 2021-07-24 MED ORDER — BUPIVACAINE LIPOSOME 1.3 % IJ SUSP
INTRAMUSCULAR | Status: AC
  Filled 2021-07-24: qty 20

## 2021-07-24 MED ORDER — SODIUM CHLORIDE FLUSH 0.9 % IV SOLN
INTRAVENOUS | Status: DC | PRN
  Administered 2021-07-24: 75 mL

## 2021-07-24 MED ORDER — CHLORHEXIDINE GLUCONATE 0.12 % MT SOLN
15.0000 mL | Freq: Once | OROMUCOSAL | Status: AC
  Administered 2021-07-24: 15 mL via OROMUCOSAL
  Filled 2021-07-24: qty 15

## 2021-07-24 MED ORDER — HYOSCYAMINE SULFATE 0.125 MG PO TABS
0.1250 mg | ORAL_TABLET | Freq: Four times a day (QID) | ORAL | Status: DC | PRN
  Filled 2021-07-24 (×2): qty 1

## 2021-07-24 MED ORDER — FENTANYL CITRATE (PF) 100 MCG/2ML IJ SOLN
25.0000 ug | INTRAMUSCULAR | Status: DC | PRN
  Administered 2021-07-24: 50 ug via INTRAVENOUS
  Administered 2021-07-24 (×4): 25 ug via INTRAVENOUS

## 2021-07-24 MED ORDER — PHENYLEPHRINE HCL-NACL 20-0.9 MG/250ML-% IV SOLN
INTRAVENOUS | Status: DC | PRN
  Administered 2021-07-24: 20 ug/min via INTRAVENOUS

## 2021-07-24 MED ORDER — SODIUM CHLORIDE 0.9 % IR SOLN
Status: DC | PRN
  Administered 2021-07-24: 1

## 2021-07-24 MED ORDER — TRAMADOL HCL 50 MG PO TABS
50.0000 mg | ORAL_TABLET | Freq: Four times a day (QID) | ORAL | Status: DC | PRN
  Administered 2021-07-25 – 2021-07-26 (×2): 50 mg via ORAL
  Filled 2021-07-24 (×2): qty 1

## 2021-07-24 MED ORDER — IRBESARTAN 150 MG PO TABS
150.0000 mg | ORAL_TABLET | Freq: Every day | ORAL | Status: DC
  Administered 2021-07-25: 150 mg via ORAL
  Filled 2021-07-24: qty 1

## 2021-07-24 MED ORDER — VANCOMYCIN HCL IN DEXTROSE 1-5 GM/200ML-% IV SOLN
1000.0000 mg | Freq: Two times a day (BID) | INTRAVENOUS | Status: AC
  Administered 2021-07-24: 1000 mg via INTRAVENOUS
  Filled 2021-07-24: qty 200

## 2021-07-24 MED ORDER — MORPHINE SULFATE (PF) 2 MG/ML IV SOLN
2.0000 mg | INTRAVENOUS | Status: DC | PRN
  Administered 2021-07-25 – 2021-07-26 (×2): 2 mg via INTRAVENOUS
  Filled 2021-07-24 (×3): qty 1

## 2021-07-24 MED ORDER — FENTANYL CITRATE (PF) 250 MCG/5ML IJ SOLN
INTRAMUSCULAR | Status: AC
  Filled 2021-07-24: qty 5

## 2021-07-24 MED ORDER — PANTOPRAZOLE SODIUM 40 MG PO TBEC
40.0000 mg | DELAYED_RELEASE_TABLET | Freq: Two times a day (BID) | ORAL | Status: DC
  Administered 2021-07-24 – 2021-07-27 (×6): 40 mg via ORAL
  Filled 2021-07-24 (×6): qty 1

## 2021-07-24 MED ORDER — VERAPAMIL HCL ER 240 MG PO TBCR
240.0000 mg | EXTENDED_RELEASE_TABLET | Freq: Every day | ORAL | Status: DC
Start: 2021-07-25 — End: 2021-07-27
  Administered 2021-07-25 – 2021-07-27 (×3): 240 mg via ORAL
  Filled 2021-07-24 (×3): qty 1

## 2021-07-24 MED ORDER — LOPERAMIDE HCL 2 MG PO CAPS
2.0000 mg | ORAL_CAPSULE | Freq: Four times a day (QID) | ORAL | Status: DC | PRN
  Administered 2021-07-25: 2 mg via ORAL
  Filled 2021-07-24: qty 1

## 2021-07-24 MED ORDER — PROPOFOL 10 MG/ML IV BOLUS
INTRAVENOUS | Status: DC | PRN
  Administered 2021-07-24: 30 mg via INTRAVENOUS
  Administered 2021-07-24: 170 mg via INTRAVENOUS

## 2021-07-24 MED ORDER — SUGAMMADEX SODIUM 200 MG/2ML IV SOLN
INTRAVENOUS | Status: DC | PRN
  Administered 2021-07-24: 200 mg via INTRAVENOUS

## 2021-07-24 MED ORDER — 0.9 % SODIUM CHLORIDE (POUR BTL) OPTIME
TOPICAL | Status: DC | PRN
  Administered 2021-07-24: 2000 mL

## 2021-07-24 MED ORDER — ENOXAPARIN SODIUM 40 MG/0.4ML IJ SOSY
40.0000 mg | PREFILLED_SYRINGE | Freq: Every day | INTRAMUSCULAR | Status: DC
  Administered 2021-07-25 – 2021-07-27 (×3): 40 mg via SUBCUTANEOUS
  Filled 2021-07-24 (×3): qty 0.4

## 2021-07-24 MED ORDER — ROCURONIUM BROMIDE 10 MG/ML (PF) SYRINGE
PREFILLED_SYRINGE | INTRAVENOUS | Status: DC | PRN
  Administered 2021-07-24: 70 mg via INTRAVENOUS
  Administered 2021-07-24: 30 mg via INTRAVENOUS
  Administered 2021-07-24: 20 mg via INTRAVENOUS
  Administered 2021-07-24: 30 mg via INTRAVENOUS

## 2021-07-24 MED ORDER — DEXAMETHASONE SODIUM PHOSPHATE 10 MG/ML IJ SOLN
INTRAMUSCULAR | Status: DC | PRN
  Administered 2021-07-24: 10 mg via INTRAVENOUS

## 2021-07-24 MED ORDER — ALBUTEROL SULFATE (2.5 MG/3ML) 0.083% IN NEBU: 2.5000 mg | INHALATION_SOLUTION | Freq: Four times a day (QID) | RESPIRATORY_TRACT | Status: DC | PRN

## 2021-07-24 MED ORDER — LIDOCAINE 2% (20 MG/ML) 5 ML SYRINGE
INTRAMUSCULAR | Status: DC | PRN
  Administered 2021-07-24: 80 mg via INTRAVENOUS

## 2021-07-24 MED ORDER — ASPIRIN EC 325 MG PO TBEC
325.0000 mg | DELAYED_RELEASE_TABLET | Freq: Every day | ORAL | Status: DC
  Administered 2021-07-25 – 2021-07-27 (×3): 325 mg via ORAL
  Filled 2021-07-24 (×3): qty 1

## 2021-07-24 MED ORDER — OXYCODONE HCL 5 MG PO TABS
5.0000 mg | ORAL_TABLET | ORAL | Status: DC | PRN
  Administered 2021-07-24: 10 mg via ORAL
  Administered 2021-07-25 – 2021-07-26 (×5): 5 mg via ORAL
  Administered 2021-07-27: 10 mg via ORAL
  Administered 2021-07-27: 5 mg via ORAL
  Filled 2021-07-24 (×2): qty 1
  Filled 2021-07-24: qty 2
  Filled 2021-07-24: qty 1
  Filled 2021-07-24: qty 2
  Filled 2021-07-24 (×2): qty 1
  Filled 2021-07-24: qty 2
  Filled 2021-07-24: qty 1

## 2021-07-24 MED ORDER — BUPIVACAINE HCL (PF) 0.5 % IJ SOLN
INTRAMUSCULAR | Status: AC
  Filled 2021-07-24: qty 30

## 2021-07-24 MED ORDER — GABAPENTIN 600 MG PO TABS
600.0000 mg | ORAL_TABLET | Freq: Two times a day (BID) | ORAL | Status: DC
  Administered 2021-07-24 – 2021-07-27 (×6): 600 mg via ORAL
  Filled 2021-07-24 (×6): qty 1

## 2021-07-24 MED ORDER — ONDANSETRON HCL 4 MG/2ML IJ SOLN
4.0000 mg | Freq: Four times a day (QID) | INTRAMUSCULAR | Status: DC | PRN
  Administered 2021-07-26: 4 mg via INTRAVENOUS
  Filled 2021-07-24: qty 2

## 2021-07-24 SURGICAL SUPPLY — 101 items
APPLIER CLIP ROT 10 11.4 M/L (STAPLE)
BLADE CLIPPER SURG (BLADE) ×2 IMPLANT
CANISTER SUCT 3000ML PPV (MISCELLANEOUS) ×4 IMPLANT
CANNULA REDUC XI 12-8 STAPL (CANNULA) ×2
CANNULA REDUCER 12-8 DVNC XI (CANNULA) ×2 IMPLANT
CLIP APPLIE ROT 10 11.4 M/L (STAPLE) IMPLANT
CNTNR URN SCR LID CUP LEK RST (MISCELLANEOUS) ×5 IMPLANT
CONN ST 1/4X3/8  BEN (MISCELLANEOUS) ×1
CONN ST 1/4X3/8 BEN (MISCELLANEOUS) IMPLANT
CONT SPEC 4OZ STRL OR WHT (MISCELLANEOUS) ×17
DEFOGGER SCOPE WARMER CLEARIFY (MISCELLANEOUS) ×2 IMPLANT
DERMABOND ADVANCED (GAUZE/BANDAGES/DRESSINGS) ×1
DERMABOND ADVANCED .7 DNX12 (GAUZE/BANDAGES/DRESSINGS) ×1 IMPLANT
DRAIN CHANNEL 28F RND 3/8 FF (WOUND CARE) ×1 IMPLANT
DRAIN CHANNEL 32F RND 10.7 FF (WOUND CARE) IMPLANT
DRAPE ARM DVNC X/XI (DISPOSABLE) ×4 IMPLANT
DRAPE COLUMN DVNC XI (DISPOSABLE) ×1 IMPLANT
DRAPE CV SPLIT W-CLR ANES SCRN (DRAPES) ×2 IMPLANT
DRAPE DA VINCI XI ARM (DISPOSABLE) ×4
DRAPE DA VINCI XI COLUMN (DISPOSABLE) ×1
DRAPE HALF SHEET 40X57 (DRAPES) ×2 IMPLANT
DRAPE INCISE IOBAN 66X45 STRL (DRAPES) IMPLANT
DRAPE ORTHO SPLIT 77X108 STRL (DRAPES) ×1
DRAPE SURG ORHT 6 SPLT 77X108 (DRAPES) ×1 IMPLANT
ELECT BLADE 6.5 EXT (BLADE) IMPLANT
ELECT REM PT RETURN 9FT ADLT (ELECTROSURGICAL) ×2
ELECTRODE REM PT RTRN 9FT ADLT (ELECTROSURGICAL) ×1 IMPLANT
GAUZE KITTNER 4X5 RF (MISCELLANEOUS) ×5 IMPLANT
GAUZE SPONGE 4X4 12PLY STRL (GAUZE/BANDAGES/DRESSINGS) ×2 IMPLANT
GLOVE SURG MICRO LTX SZ7.5 (GLOVE) ×4 IMPLANT
GOWN STRL REUS W/ TWL LRG LVL3 (GOWN DISPOSABLE) ×2 IMPLANT
GOWN STRL REUS W/ TWL XL LVL3 (GOWN DISPOSABLE) ×2 IMPLANT
GOWN STRL REUS W/TWL 2XL LVL3 (GOWN DISPOSABLE) ×2 IMPLANT
GOWN STRL REUS W/TWL LRG LVL3 (GOWN DISPOSABLE) ×2
GOWN STRL REUS W/TWL XL LVL3 (GOWN DISPOSABLE) ×4
HEMOSTAT SURGICEL 2X14 (HEMOSTASIS) ×5 IMPLANT
IRRIGATION STRYKERFLOW (MISCELLANEOUS) ×1 IMPLANT
IRRIGATOR STRYKERFLOW (MISCELLANEOUS) ×2
KIT BASIN OR (CUSTOM PROCEDURE TRAY) ×2 IMPLANT
KIT SUCTION CATH 14FR (SUCTIONS) IMPLANT
KIT TURNOVER KIT B (KITS) ×2 IMPLANT
NDL HYPO 25GX1X1/2 BEV (NEEDLE) ×1 IMPLANT
NDL SPNL 22GX3.5 QUINCKE BK (NEEDLE) ×1 IMPLANT
NEEDLE HYPO 25GX1X1/2 BEV (NEEDLE) ×2 IMPLANT
NEEDLE SPNL 22GX3.5 QUINCKE BK (NEEDLE) ×2 IMPLANT
NS IRRIG 1000ML POUR BTL (IV SOLUTION) ×2 IMPLANT
PACK CHEST (CUSTOM PROCEDURE TRAY) ×2 IMPLANT
PAD ARMBOARD 7.5X6 YLW CONV (MISCELLANEOUS) ×4 IMPLANT
PORT ACCESS TROCAR AIRSEAL 12 (TROCAR) ×1 IMPLANT
PORT ACCESS TROCAR AIRSEAL 5M (TROCAR) ×1
RELOAD STAPLE 45 2.5 WHT DVNC (STAPLE) IMPLANT
RELOAD STAPLE 45 3.5 BLU DVNC (STAPLE) IMPLANT
RELOAD STAPLE 45 4.3 GRN DVNC (STAPLE) IMPLANT
RELOAD STAPLER 2.5X45 WHT DVNC (STAPLE) ×4 IMPLANT
RELOAD STAPLER 3.5X45 BLU DVNC (STAPLE) ×5 IMPLANT
RELOAD STAPLER 4.3X45 GRN DVNC (STAPLE) ×3 IMPLANT
SCISSORS LAP 5X35 DISP (ENDOMECHANICALS) IMPLANT
SEAL CANN UNIV 5-8 DVNC XI (MISCELLANEOUS) ×2 IMPLANT
SEAL XI 5MM-8MM UNIVERSAL (MISCELLANEOUS) ×2
SEALANT PROGEL (MISCELLANEOUS) IMPLANT
SET TRI-LUMEN FLTR TB AIRSEAL (TUBING) ×2 IMPLANT
SOLUTION ELECTROLUBE (MISCELLANEOUS) ×2 IMPLANT
SPONGE T-LAP 18X18 ~~LOC~~+RFID (SPONGE) ×5 IMPLANT
STAPLER 45 SUREFORM CVD (STAPLE) ×1
STAPLER 45 SUREFORM CVD DVNC (STAPLE) IMPLANT
STAPLER CANNULA SEAL DVNC XI (STAPLE) ×2 IMPLANT
STAPLER CANNULA SEAL XI (STAPLE) ×2
STAPLER RELOAD 2.5X45 WHITE (STAPLE) ×4
STAPLER RELOAD 2.5X45 WHT DVNC (STAPLE) ×4
STAPLER RELOAD 3.5X45 BLU DVNC (STAPLE) ×5
STAPLER RELOAD 3.5X45 BLUE (STAPLE) ×5
STAPLER RELOAD 4.3X45 GREEN (STAPLE) ×3
STAPLER RELOAD 4.3X45 GRN DVNC (STAPLE) ×3
SUT PDS AB 3-0 SH 27 (SUTURE) IMPLANT
SUT PROLENE 4 0 RB 1 (SUTURE)
SUT PROLENE 4-0 RB1 .5 CRCL 36 (SUTURE) IMPLANT
SUT SILK  1 MH (SUTURE) ×1
SUT SILK 1 MH (SUTURE) ×2 IMPLANT
SUT SILK 1 TIES 10X30 (SUTURE) IMPLANT
SUT SILK 2 0 SH (SUTURE) ×1 IMPLANT
SUT SILK 2 0SH CR/8 30 (SUTURE) IMPLANT
SUT SILK 3 0SH CR/8 30 (SUTURE) IMPLANT
SUT VIC AB 1 CTX 36 (SUTURE) ×1
SUT VIC AB 1 CTX36XBRD ANBCTR (SUTURE) IMPLANT
SUT VIC AB 2-0 CTX 36 (SUTURE) ×1 IMPLANT
SUT VIC AB 3-0 MH 27 (SUTURE) IMPLANT
SUT VIC AB 3-0 X1 27 (SUTURE) ×4 IMPLANT
SUT VICRYL 0 TIES 12 18 (SUTURE) ×2 IMPLANT
SUT VICRYL 0 UR6 27IN ABS (SUTURE) ×4 IMPLANT
SUT VICRYL 2 TP 1 (SUTURE) IMPLANT
SYR 20ML LL LF (SYRINGE) ×4 IMPLANT
SYSTEM RETRIEVAL ANCHOR 12 (MISCELLANEOUS) ×1 IMPLANT
SYSTEM SAHARA CHEST DRAIN ATS (WOUND CARE) ×2 IMPLANT
TAPE CLOTH 4X10 WHT NS (GAUZE/BANDAGES/DRESSINGS) ×2 IMPLANT
TIP APPLICATOR SPRAY EXTEND 16 (VASCULAR PRODUCTS) IMPLANT
TOWEL GREEN STERILE (TOWEL DISPOSABLE) ×4 IMPLANT
TRAY FOLEY MTR SLVR 16FR STAT (SET/KITS/TRAYS/PACK) ×2 IMPLANT
TROCAR BLADELESS 15MM (ENDOMECHANICALS) IMPLANT
TROCAR XCEL 12X100 BLDLESS (ENDOMECHANICALS) IMPLANT
TROCAR XCEL BLADELESS 5X75MML (TROCAR) IMPLANT
WATER STERILE IRR 1000ML POUR (IV SOLUTION) ×2 IMPLANT

## 2021-07-24 NOTE — Discharge Instructions (Addendum)
Patient to place dry 4x4s and tape over chest tube wound (has black suture). Change daily and PRN. Once stops draining, likely in the next few days, may let wound open to the air. Other wounds do not need to be covered;let open to the air. ? ?Robot-Assisted Thoracic Surgery, Care After ?The following information offers guidance on how to care for yourself after your procedure. Your health care provider may also give you more specific instructions. If you have problems or questions, contact your health care provider. ?What can I expect after the procedure? ?After the procedure, it is common to have: ?Some pain and aches in the area of your surgical incisions. ?Pain when breathing in (inhaling) and coughing. ?Tiredness (fatigue). ?Trouble sleeping. ?Constipation. ?Follow these instructions at home: ?Medicines ?Take over-the-counter and prescription medicines only as told by your health care provider. ?If you were prescribed an antibiotic medicine, take it as told by your health care provider. Do not stop taking the antibiotic even if you start to feel better. ?Talk with your health care provider about safe and effective ways to manage pain after your procedure. Pain management should fit your specific health needs. ?Take pain medicine before pain becomes severe. Relieving and controlling your pain will make breathing easier for you. ?Ask your health care provider if the medicine prescribed to you requires you to avoid driving or using machinery. ?Eating and drinking ?Follow instructions from your health care provider about eating or drinking restrictions. These will vary depending on what procedure you had. Your health care provider may recommend: ?A liquid diet or soft diet for the first few days. ?Meals that are smaller and more frequent. ?A diet of fruits, vegetables, whole grains, and low-fat proteins. ?Limiting foods that are high in fat and processed sugar, including fried or sweet foods. ?Incision care ?Follow  instructions from your health care provider about how to take care of your incisions. Make sure you: ?Wash your hands with soap and water for at least 20 seconds before and after you change your bandage (dressing). If soap and water are not available, use hand sanitizer. ?Change your dressing as told by your health care provider. ?Leave stitches (sutures), skin glue, or adhesive strips in place. These skin closures may need to stay in place for 2 weeks or longer. If adhesive strip edges start to loosen and curl up, you may trim the loose edges. Do not remove adhesive strips completely unless your health care provider tells you to do that. ?Check your incision area every day for signs of infection. Check for: ?Redness, swelling, or more pain. ?Fluid or blood. ?Warmth. ?Pus or a bad smell. ?Activity ?Return to your normal activities as told by your health care provider. Ask your health care provider what activities are safe for you. ?Ask your health care provider when it is safe for you to drive. ?Do not lift anything that is heavier than 10 lb (4.5 kg), or the limit that you are told, until your health care provider says that it is safe. ?Rest as told by your health care provider. ?Avoid sitting for a long time without moving. Get up to take short walks every 1-2 hours. This is important to improve blood flow and breathing. Ask for help if you feel weak or unsteady. ?Do exercises as told by your health care provider. ?Pneumonia prevention ?Do deep breathing exercises and cough regularly as directed. This helps clear mucus and opens your lungs. Doing this helps prevent lung infection (pneumonia). ?If you were given  an incentive spirometer, use it as told. An incentive spirometer is a tool that measures how well you are filling your lungs with each breath. ?Coughing may hurt less if you try to support your chest. This is called splinting. Try one of these when you cough: ?Hold a pillow against your chest. ?Place the  palms of both hands on top of your incision area. ?Do not use any products that contain nicotine or tobacco. These products include cigarettes, chewing tobacco, and vaping devices, such as e-cigarettes. If you need help quitting, ask your health care provider. ?Avoid secondhand smoke. ?General instructions ?If you have a drainage tube: ?Follow instructions from your health care provider about how to take care of it. ?Do not travel by airplane after your tube is removed until your health care provider tells you it is safe. ?You may need to take these actions to prevent or treat constipation: ?Drink enough fluid to keep your urine pale yellow. ?Take over-the-counter or prescription medicines. ?Eat foods that are high in fiber, such as beans, whole grains, and fresh fruits and vegetables. ?Limit foods that are high in fat and processed sugars, such as fried or sweet foods. ?Keep all follow-up visits. This is important. ?Contact a health care provider if: ?You have redness, swelling, or more pain around an incision. ?You have fluid or blood coming from an incision. ?An incision feels warm to the touch. ?You have pus or a bad smell coming from an incision. ?You have a fever. ?You cannot eat or drink without vomiting. ?Your pain medicine is not controlling your pain. ?Get help right away if: ?You have chest pain. ?Your heart is beating quickly. ?You have trouble breathing. ?You have trouble speaking. ?You are confused. ?You feel weak or dizzy, or you faint. ?These symptoms may represent a serious problem that is an emergency. Do not wait to see if the symptoms will go away. Get medical help right away. Call your local emergency services (911 in the [place] Do not drive yourself to the hospital. ?Summary ?Talk with your health care provider about safe and effective ways to manage pain after your procedure. Pain management should fit your specific health needs. ?Return to your normal activities as told by your health care  provider. Ask your health care provider what activities are safe for you. ?Do deep breathing exercises and cough regularly as directed. This helps to clear mucus and prevent pneumonia. If it hurts to cough, ease pain by holding a pillow against your chest or by placing the palms of both hands over your incisions. ?This information is not intended to replace advice given to you by your health care provider. Make sure you discuss any questions you have with your health care provider. ?Document Revised: 12/07/2019 Document Reviewed: 12/07/2019 ?Elsevier Patient Education ? [place]. ?  ?

## 2021-07-24 NOTE — Anesthesia Procedure Notes (Signed)
Procedure Name: Intubation ?Date/Time: 07/24/2021 12:23 PM ?Performed by: Dorann Lodge, CRNA ?Pre-anesthesia Checklist: Patient identified, Emergency Drugs available, Suction available and Patient being monitored ?Patient Re-evaluated:Patient Re-evaluated prior to induction ?Oxygen Delivery Method: Circle System Utilized ?Preoxygenation: Pre-oxygenation with 100% oxygen ?Induction Type: IV induction ?Ventilation: Mask ventilation without difficulty and Oral airway inserted - appropriate to patient size ?Laryngoscope Size: Mac and 4 ?Grade View: Grade I ?Endobronchial tube: Left, Double lumen EBT, EBT position confirmed by fiberoptic bronchoscope and EBT position confirmed by auscultation and 39 Fr ?Number of attempts: 1 ?Airway Equipment and Method: Stylet, Oral airway and Fiberoptic brochoscope ?Placement Confirmation: ETT inserted through vocal cords under direct vision, positive ETCO2 and breath sounds checked- equal and bilateral ?Secured at: 29 cm ?Tube secured with: Tape ?Dental Injury: Teeth and Oropharynx as per pre-operative assessment  ? ? ? ? ?

## 2021-07-24 NOTE — Discharge Summary (Addendum)
Physician Discharge Summary       [address]       [place] [address]             [phone]    Patient ID: Sean Massey MRN: [id number] DOB/AGE: Oct 09, 1945 76 y.o.  Admit date: 07/24/2021 Discharge date: 07/27/2021  Admission Diagnoses: Left upper lobe lung nodule Discharge Diagnoses:  S/p Xi robotic assisted left thoracoscopy, wedge LUL, LUL, LN dissection, and intercostal nerve block (ribs 3 through 10). History of the following:  Asthma      rare inhaler use   Colitis     EKG, abnormal      HX OF LBBB AND SOMETIMES RBBB PER CARDIOLOGY NOTE DR Darral Dash 01-09-2019   GERD (gastroesophageal reflux disease)     High cholesterol     History of kidney stones     History of stomach ulcers     HTN (hypertension)     Prostate cancer ([place]) 2009     Consults: None  Procedure (s): Xi robotic-assisted left upper lobectomy, lymph node dissection, intercostal nerve blocks levels 3 through 10 by Dr. [name] on 07/24/2021.  Pathology: Final result is pending  HPI: Sean Massey is a 76 year old man with a history of tobacco abuse, ulcerative colitis, C. difficile colitis, prostate cancer, hypertension, hyperlipidemia, reflux, ulcers, asthma, and an abnormal EKG with a bundle branch block.  He smoked 1 to 2 packs a day for 40 years (40-pack-year history) prior to quitting 20 years ago.     He recently had some teeth pulled.  He was having some issues related to that and went to the hospital.  A CT of the neck was done.  It showed a left upper lobe lung nodule.  He was referred to Dr. Delton Coombes.  PET/CT showed the nodule was hypermetabolic with an SUV of 4.  There was no evidence of regional or distant metastatic disease.  He has a history of asthma and has an inhaler but seldom uses it.  He does get short of breath with heavy exertion.  He is not having any chest pain, pressure, or tightness.  He has issues with colitis with urgency and diarrhea.  No change in  appetite.  He has lost about 9 pounds in the past 6 months.  Dr. [name] recommended the patient proceed with robotic left VATS for wedge resection for definitive diagnosis to be followed by lobectomy or segmentectomy depending on intraoperative findings.  Obviously, if this was nonmalignant we would not need to do additional resection. Potential risks, benefits, and complications of the surgery were discussed with the patient and he agreed to proceed with surgery.  Hospital Course:  Lynard Postlewait presented to [facility].  He underwent Robotic Assisted Video Thoracoscopy with Left Upper Lobe Wedge Resection, Completion Left Upper Lobectomy, Lymph Node Dissection, Intercostal Nerve Block.  He tolerated the procedure, was extubated, and taken to the SICU in stable condition.  The patient's chest tube was left to suction overnight.  Follow up CXR showed no pneumothorax and this chest tube was transitioned to water seal.  Follow up CXR showed no evidence of pneumothorax.  His chest tube was removed without difficulty.  Follow up CXR showed no pneumothorax, stable left chest wall subcutaneous emphysema. He has been ambulating on room air during the day;however, he does desat at night and requires oxygen. Will arrange for home oxygen at night. Of note, per patient's wife, he used to require oxygen at night;? OSA as well. Patient  has a history of colitis. He initially had some diarrhea post op as well as nausea. This has resolved. He is on his home medications for colitis. He has some distention and pain with palpation this am. He feels he may be having a flare up of his colitis. He is passing flatus but has not had a bowel movement yet. All wounds are clean, dry, healing without signs of infection. There is no drainage from left chest tube wound this am. Patient and wife instructed if there is drainage at chest tube wound, use dry 4x4s and tape. Change daily and PRN. Once stops draining, let wound open  to the air. Patient has documented desaturation, during the day and especially at night. Home oxygen has been arranged. As discussed with Dr. [name], patient is stable for discharge home.     Latest Vital Signs: Blood pressure 117/78, pulse 78, temperature 98.6 F (37 C), temperature source Oral, resp. rate 16, height 5\' 9"  (1.753 m), weight 101.6 kg, SpO2 90 %.  Physical Exam: Cardiovascular: RRR Pulmonary: Clear to auscultation on the right and slightly diminished left apex Abdomen: Soft, distended, some tenderness to palpation, occasional bowel sounds present. Extremities: Mild bilateral lower extremity edema. Wounds: Clean and dry.  No erythema or signs of infection.    Discharge Condition:Stable and discharged to home.  Recent laboratory studies:  Lab Results  Component Value Date   WBC 7.0 07/26/2021   HGB 8.5 (L) 07/26/2021   HCT 27.0 (L) 07/26/2021   MCV 95.4 07/26/2021   PLT 252 07/26/2021   Lab Results  Component Value Date   NA 134 (L) 07/26/2021   K 3.8 07/26/2021   CL 103 07/26/2021   CO2 26 07/26/2021   CREATININE 1.27 (H) 07/26/2021   GLUCOSE 112 (H) 07/26/2021      Diagnostic Studies: DG Chest 2 View  Result Date: 07/27/2021 CLINICAL DATA:  Chest tube removal EXAM: CHEST - 2 VIEW COMPARISON:  AP chest 07/26/2021 (multiple studies; AP chest 07/25/2021 FINDINGS: Cardiac silhouette is again moderately enlarged. Mediastinal contours are within normal limits. Moderately decreased lung volumes are again seen. A left chest tube seen on the earlier of to 07/26/2021 radiographs is again no longer visualized. There is again subcutaneous emphysema within the mid and inferior left hemithorax and left flank soft tissues. There is again streaky linear subsegmental atelectasis within the bilateral lung bases. Remote right clavicular fracture is partially visualized. No pneumothorax is seen. No pleural effusion. Vertebral body augmentation cement is again seen within the  lower thoracic spine. IMPRESSION: Again no pneumothorax is seen following removal of right chest tube on 07/26/2021. Left chest wall subcutaneous emphysema is similar to prior. Unchanged low lung volumes with subsegmental atelectasis. Electronically Signed   By: Yvonne Kendall M.D.   On: 07/27/2021 08:05   DG Chest 2 View  Result Date: 07/21/2021 CLINICAL DATA:  Preoperative study. EXAM: CHEST - 2 VIEW COMPARISON:  PET-CT June 04, 2021.  Chest x-ray May 13, 2021. FINDINGS: The patient's known left upper lobe neoplasm is not well visualized on either today's chest x-ray or the previous chest x-ray. The finding is very subtle on x-ray imaging. The patient is status post vertebroplasty of a midthoracic vertebral body. Visualized bones are otherwise unremarkable. The cardiomediastinal silhouette is stable. No pneumothorax. No other acute abnormalities. IMPRESSION: 1. The patient's known left upper lobe neoplasm is not well visualized on today's chest x-ray. The finding is very subtle on today's imaging. 2. No other abnormalities identified. Electronically  Signed   By: Dorise Bullion III M.D.   On: 07/21/2021 20:06   DG Chest 1V REPEAT Same Day  Result Date: 07/26/2021 CLINICAL DATA:  Chest tube removal. EXAM: CHEST - 1 VIEW SAME DAY COMPARISON:  Radiograph earlier today. FINDINGS: The left-sided chest tube has been removed. There is no visible pneumothorax. Persistent subcutaneous emphysema in the left chest wall and supraclavicular soft tissues. Low lung volumes persist. Stable heart size and mediastinal contours. Streaky right lung base atelectasis. Remote right clavicle fracture. IMPRESSION: 1. Removal of left-sided chest tube. No visible pneumothorax. Stable subcutaneous emphysema in the left chest wall and supraclavicular soft tissues. 2. Low lung volumes with right lung base atelectasis. Electronically Signed   By: Keith Rake M.D.   On: 07/26/2021 16:19   DG Chest Port 1 View  Result Date:  07/26/2021 CLINICAL DATA:  Pneumothorax EXAM: PORTABLE CHEST 1 VIEW COMPARISON:  07/25/2021 FINDINGS: Shallow inspiration with low lung volumes. Left chest tube has retracted with new or increased subcutaneous emphysema. No definite pneumothorax. Patchy bibasilar atelectasis. Stable cardiomediastinal contours IMPRESSION: Retraction of chest tube with new or increased subcutaneous emphysema. No definite pneumothorax. Patchy bibasilar atelectasis. Electronically Signed   By: Macy Mis M.D.   On: 07/26/2021 10:49   DG Chest Port 1 View  Result Date: 07/25/2021 CLINICAL DATA:  Post lobectomy of lung EXAM: PORTABLE CHEST 1 VIEW COMPARISON:  07/24/2021 FINDINGS: Left apical chest tube. No definite pneumothorax. Improved aeration at the right lung base. Probable persistent patchy atelectasis left lung base. Stable cardiomediastinal contours. IMPRESSION: No definite pneumothorax.  Improved aeration at the right lung base. Electronically Signed   By: Macy Mis M.D.   On: 07/25/2021 11:49   DG Chest Port 1 View  Result Date: 07/24/2021 CLINICAL DATA:  Status post lung lobectomy. EXAM: PORTABLE CHEST 1 VIEW COMPARISON:  Chest two views 07/21/2021 PET-CT 06/04/2021 FINDINGS: Interval left upper lung resection of the previously seen left upper lobe neoplasm. There is a left-sided chest tube. No definite left-sided pneumothorax is seen. At the most, there could be a tiny left apical pneumothorax. There are moderately decreased lung volumes bilaterally. Moderate bibasilar bronchovascular crowding/subsegmental atelectasis. Cardiac silhouette and mediastinal contours are grossly within normal limits given low lung volumes and AP technique. No definite pleural effusion. No acute skeletal abnormality. IMPRESSION: Interval left upper lobectomy with left-sided chest tube in place. No definite pneumothorax. At the most, there could be a tiny left apical pneumothorax, not unexpected following the recent surgery.  Electronically Signed   By: Yvonne Kendall M.D.   On: 07/24/2021 17:02      Discharge Medications: Allergies as of 07/27/2021       Reactions   Cefdinir Other (See Comments)   Other reaction(s): Other (See Comments) Cannot take - causes problems with colitis---- "Worsens Diarrhea" Cannot take - causes problems with colitis        Medication List     TAKE these medications    albuterol 108 (90 Base) MCG/ACT inhaler Commonly known as: VENTOLIN HFA Inhale 2 puffs into the lungs every 6 (six) hours as needed for shortness of breath.   ALPRAZolam 0.5 MG tablet Commonly known as: XANAX Take 0.5 mg by mouth daily as needed for anxiety.   aspirin 325 MG EC tablet Take 325 mg by mouth daily.   atorvastatin 10 MG tablet Commonly known as: LIPITOR Take 1 tablet (10 mg total) by mouth at bedtime.   Caltrate 600+D3 Soft 600-20 MG-MCG Chew Generic drug: Calcium  Carb-Cholecalciferol Chew 1 tablet by mouth daily.   fluticasone 110 MCG/ACT inhaler Commonly known as: FLOVENT HFA Inhale 1 puff into the lungs 2 (two) times daily.   fluticasone 50 MCG/ACT nasal spray Commonly known as: FLONASE Place 1 spray into both nostrils daily.   gabapentin 600 MG tablet Commonly known as: NEURONTIN Take 1 tablet (600 mg total) by mouth 2 (two) times daily.   hyoscyamine 0.125 MG tablet Commonly known as: LEVSIN Take 0.125 mg by mouth every 6 (six) hours as needed for cramping.   loperamide 2 MG tablet Commonly known as: IMODIUM A-D Take 2 mg by mouth 4 (four) times daily as needed for diarrhea or loose stools.   oxyCODONE 5 MG immediate release tablet Commonly known as: Oxy IR/ROXICODONE Take 1 tablet (5 mg total) by mouth every 6 (six) hours as needed for severe pain.   pantoprazole 40 MG tablet Commonly known as: PROTONIX Take 40 mg by mouth 2 (two) times daily.   telmisartan 80 MG tablet Commonly known as: MICARDIS Take 80 mg by mouth daily.   verapamil 240 MG CR  tablet Commonly known as: CALAN-SR Take 240 mg by mouth daily.   Xeljanz 5 MG Tabs Generic drug: Tofacitinib Citrate Take 1 tablet (5 mg total) by mouth 2 (two) times daily. Resume once antibiotics are complete               Durable Medical Equipment  (From admission, onward)           Start     Ordered   07/27/21 1358  For home use only DME oxygen  Once       Question Answer Comment  Length of Need 6 Months   Mode or (Route) Nasal cannula   Liters per Minute 2   Frequency Continuous (stationary and portable oxygen unit needed)   Oxygen delivery system Gas      07/27/21 1357            Follow Up Appointments:  Follow-up Information     [name], MD. Go on 08/06/2021.   Specialty: Cardiothoracic Surgery Why: PA/LAT CXR to be taken (at [facility] which is in the same building as Dr. Leonarda Salon office) on 05/11 at 3:00;Appointment time is at 3:30 pm Contact information: [place] [id number] [phone]                 Signed: Sharalyn Ink [facility] 07/27/2021, 2:00 PM

## 2021-07-24 NOTE — Anesthesia Procedure Notes (Signed)
Arterial Line Insertion ?Start/End4/28/2023 11:45 AM, 07/24/2021 12:15 PM ?Performed by: Betha Loa, CRNA, CRNA ? Patient location: Pre-op. ?Preanesthetic checklist: patient identified, IV checked, site marked, risks and benefits discussed, surgical consent, monitors and equipment checked, pre-op evaluation, timeout performed and anesthesia consent ?Lidocaine 1% used for infiltration ?Left, radial was placed ?Catheter size: 20 G ?Hand hygiene performed  and maximum sterile barriers used  ? ?Attempts: 2 ?Procedure performed without using ultrasound guided technique. ?Following insertion, dressing applied and Biopatch. ?Post procedure assessment: normal and unchanged ? ?Patient tolerated the procedure well with no immediate complications. ? ? ?

## 2021-07-24 NOTE — Anesthesia Postprocedure Evaluation (Signed)
Anesthesia Post Note ? ?Patient: Ryler Laskowski ? ?Procedure(s) Performed: XI ROBOTIC ASSISTED THORACOSCOPY-WEDGE RESECTION AND UPPER LEFT LUNG LOBECTOMY (Left: Chest) ?NODE DISSECTION (Left: Chest) ?INTERCOSTAL NERVE BLOCK (Left: Chest) ? ?  ? ?Patient location during evaluation: PACU ?Anesthesia Type: General ?Level of consciousness: awake and alert ?Pain management: pain level controlled ?Vital Signs Assessment: post-procedure vital signs reviewed and stable ?Respiratory status: spontaneous breathing, nonlabored ventilation, respiratory function stable and patient connected to nasal cannula oxygen ?Cardiovascular status: blood pressure returned to baseline and stable ?Postop Assessment: no apparent nausea or vomiting ?Anesthetic complications: no ? ? ?No notable events documented. ? ?Last Vitals:  ?Vitals:  ? 07/24/21 1645 07/24/21 1656  ?BP: 127/88 (!) 146/85  ?Pulse: 63 (!) 59  ?Resp: 15 13  ?Temp:  (!) 36.4 ?C  ?SpO2: 93% 94%  ?  ?Last Pain:  ?Vitals:  ? 07/24/21 1656  ?TempSrc:   ?PainSc: 6   ? ? ?  ?  ?  ?  ?  ?  ? ?March Rummage Raekwan Spelman ? ? ? ? ?

## 2021-07-24 NOTE — Interval H&P Note (Signed)
History and Physical Interval Note: ? ?07/24/2021 ?11:06 AM ? ?Sean Massey  has presented today for surgery, with the diagnosis of LUL NODULE.  The various methods of treatment have been discussed with the patient and family. After consideration of risks, benefits and other options for treatment, the patient has consented to  Procedure(s): ?XI ROBOTIC ASSISTED THORASCOPY-WEDGE RESECTION, possible segmentectomy vs upper lobectomy (Left) as a surgical intervention.  The patient's history has been reviewed, patient examined, no change in status, stable for surgery.  I have reviewed the patient's chart and labs.  Questions were answered to the patient's satisfaction.   ? ? ?[name] ? ? ?

## 2021-07-24 NOTE — Transfer of Care (Signed)
Immediate Anesthesia Transfer of Care Note ? ?Patient: Sean Massey ? ?Procedure(s) Performed: XI ROBOTIC ASSISTED THORACOSCOPY-WEDGE RESECTION AND UPPER LEFT LUNG LOBECTOMY (Left: Chest) ?NODE DISSECTION (Left: Chest) ?INTERCOSTAL NERVE BLOCK (Left: Chest) ? ?Patient Location: PACU ? ?Anesthesia Type:General ? ?Level of Consciousness: awake and drowsy ? ?Airway & Oxygen Therapy: Patient Spontanous Breathing and Patient connected to nasal cannula oxygen ? ?Post-op Assessment: Report given to RN and Post -op Vital signs reviewed and stable ? ?Post vital signs: Reviewed and stable ? ?Last Vitals:  ?Vitals Value Taken Time  ?BP 139/78 07/24/21 1601  ?Temp    ?Pulse 65 07/24/21 1603  ?Resp 16 07/24/21 1603  ?SpO2 91 % 07/24/21 1603  ?Vitals shown include unvalidated device data. ? ?Last Pain:  ?Vitals:  ? 07/24/21 1027  ?TempSrc:   ?PainSc: 0-No pain  ?   ? ?  ? ?Complications: No notable events documented. ?

## 2021-07-24 NOTE — Hospital Course (Addendum)
HPI: ?Sean Massey is a 76 year old man with a history of tobacco abuse, ulcerative colitis, C. difficile colitis, prostate cancer, hypertension, hyperlipidemia, reflux, ulcers, asthma, and an abnormal EKG with a bundle branch block.  He smoked 1 to 2 packs a day for 40 years (40-pack-year history) prior to quitting 20 years ago.   ?  ?He recently had some teeth pulled.  He was having some issues related to that and went to the hospital.  A CT of the neck was done.  It showed a left upper lobe lung nodule.  He was referred to Dr. Delton Coombes.  PET/CT showed the nodule was hypermetabolic with an SUV of 4.  There was no evidence of regional or distant metastatic disease. ? ?He has a history of asthma and has an inhaler but seldom uses it.  He does get short of breath with heavy exertion.  He is not having any chest pain, pressure, or tightness.  He has issues with colitis with urgency and diarrhea.  No change in appetite.  He has lost about 9 pounds in the past 6 months. ? ?Dr. [name] recommended the patient proceed with robotic left VATS for wedge resection for definitive diagnosis to be followed by lobectomy or segmentectomy depending on intraoperative findings.  Obviously, if this was nonmalignant we would not need to do additional resection. Potential risks, benefits, and complications of the surgery were discussed with the patient and he agreed to proceed with surgery. ? ?Hospital Course: ? ?Sean Massey presented to [facility].  He underwent Robotic Assisted Video Thoracoscopy with Left Upper Lobe Wedge Resection, Completion Left Upper Lobectomy, Lymph Node Dissection, Intercostal Nerve Block.  He tolerated the procedure, was extubated, and taken to the SICU in stable condition.  The patient's chest tube was left to suction overnight.  Follow up CXR showed no pneumothorax and this chest tube was transitioned to water seal.  Follow up CXR showed no evidence of pneumothorax.  His chest tube was  removed without difficulty.  Follow up CXR showed no pneumothorax, stable left chest wall subcutaneous emphysema. He has been ambulating on room air during the day;however, he does desat at night and requires oxygen. Will arrange for home oxygen at night. Of note, per patient's wife, he used to require oxygen at night;? OSA as well. Patient has a history of colitis. He initially had some diarrhea post op as well as nausea. This has resolved. He is on his home medications for colitis. He has some distention and pain with palpation this am. He feels he may be having a flare up of his colitis. He is passing flatus but has not had a bowel movement yet. All wounds are clean, dry, healing without signs of infection. There is no drainage from left chest tube wound this am. Patient and wife instructed if there is drainage at chest tube wound, use dry 4x4s and tape. Change daily and PRN. Once stops draining, let wound open to the air. Patient has documented desaturation, during the day and especially at night. Home oxygen has been arranged. As discussed with Dr. [name], patient is stable for discharge home. ? ?

## 2021-07-24 NOTE — Plan of Care (Signed)
?  Problem: Education: ?Goal: Knowledge of General Education information will improve ?Description: Including pain rating scale, medication(s)/side effects and non-pharmacologic comfort measures ?Outcome: Progressing ?  ?Problem: Health Behavior/Discharge Planning: ?Goal: Ability to manage health-related needs will improve ?Outcome: Progressing ?  ?Problem: Clinical Measurements: ?Goal: Ability to maintain clinical measurements within normal limits will improve ?Outcome: Progressing ?Goal: Will remain free from infection ?Outcome: Progressing ?Goal: Diagnostic test results will improve ?Outcome: Progressing ?Goal: Respiratory complications will improve ?Outcome: Progressing ?Goal: Cardiovascular complication will be avoided ?Outcome: Progressing ?  ?Problem: Activity: ?Goal: Risk for activity intolerance will decrease ?Outcome: Progressing ?  ?Problem: Nutrition: ?Goal: Adequate nutrition will be maintained ?Outcome: Progressing ?  ?Problem: Coping: ?Goal: Level of anxiety will decrease ?Outcome: Progressing ?  ?Problem: Elimination: ?Goal: Will not experience complications related to bowel motility ?Outcome: Progressing ?Goal: Will not experience complications related to urinary retention ?Outcome: Progressing ?  ?Problem: Pain Managment: ?Goal: General experience of comfort will improve ?Outcome: Progressing ?  ?Problem: Safety: ?Goal: Ability to remain free from injury will improve ?Outcome: Progressing ?  ?Problem: Skin Integrity: ?Goal: Risk for impaired skin integrity will decrease ?Outcome: Progressing ?  ?Problem: Education: ?Goal: Knowledge of disease or condition will improve ?Outcome: Progressing ?Goal: Knowledge of the prescribed therapeutic regimen will improve ?Outcome: Progressing ?  ?Problem: Activity: ?Goal: Risk for activity intolerance will decrease ?Outcome: Progressing ?  ?Problem: Cardiac: ?Goal: Will achieve and/or maintain hemodynamic stability ?Outcome: Progressing ?  ?Problem: Clinical  Measurements: ?Goal: Postoperative complications will be avoided or minimized ?Outcome: Progressing ?  ?Problem: Respiratory: ?Goal: Respiratory status will improve ?Outcome: Progressing ?  ?Problem: Pain Management: ?Goal: Pain level will decrease ?Outcome: Progressing ?  ?Problem: Skin Integrity: ?Goal: Wound healing without signs and symptoms infection will improve ?Outcome: Progressing ?  ?

## 2021-07-25 ENCOUNTER — Inpatient Hospital Stay (HOSPITAL_COMMUNITY): Payer: Medicare PPO

## 2021-07-25 ENCOUNTER — Encounter (HOSPITAL_COMMUNITY): Payer: Self-pay | Admitting: Thoracic Surgery (Cardiothoracic Vascular Surgery)

## 2021-07-25 LAB — BASIC METABOLIC PANEL
Anion gap: 10 (ref 5–15)
BUN: 16 mg/dL (ref 8–23)
CO2: 22 mmol/L (ref 22–32)
Calcium: 9.6 mg/dL (ref 8.9–10.3)
Chloride: 103 mmol/L (ref 98–111)
Creatinine, Ser: 1.4 mg/dL — ABNORMAL HIGH (ref 0.61–1.24)
GFR, Estimated: 52 mL/min — ABNORMAL LOW (ref >60)
Glucose, Bld: 163 mg/dL — ABNORMAL HIGH (ref 70–99)
Potassium: 4.3 mmol/L (ref 3.5–5.1)
Sodium: 135 mmol/L (ref 135–145)

## 2021-07-25 IMAGING — DX DG CHEST 1V PORT
1 series · 1 of 1 positions shown · non-contrast
Comparison: [DATE]

CLINICAL DATA: Post lobectomy of lung

EXAM:
PORTABLE CHEST 1 VIEW

[chest]
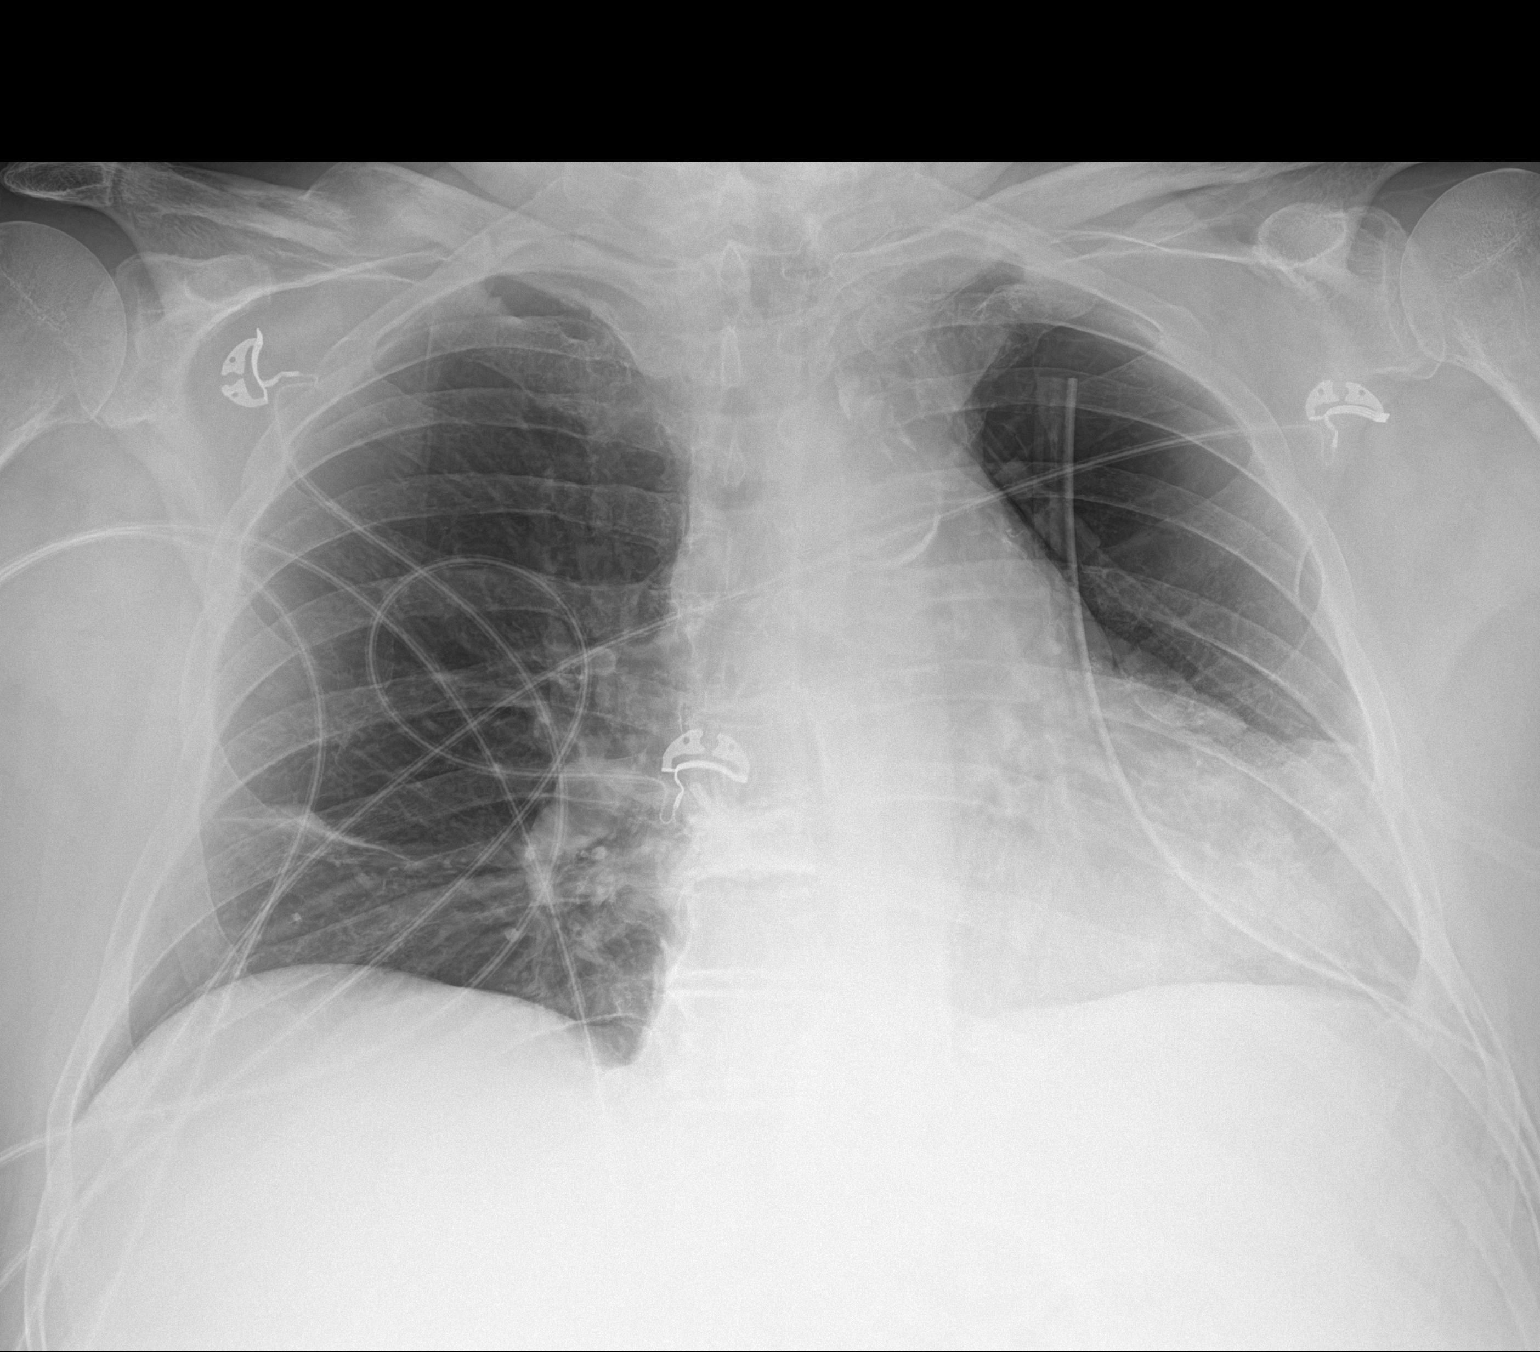

[1 of 1 positions shown; findings below may reference images not displayed]

FINDINGS: Left apical chest tube. No definite pneumothorax. Improved aeration
at the right lung base. Probable persistent patchy atelectasis left
lung base. Stable cardiomediastinal contours.
IMPRESSION: No definite pneumothorax.  Improved aeration at the right lung base.

## 2021-07-25 NOTE — Plan of Care (Signed)
?  Problem: Education: ?Goal: Knowledge of General Education information will improve ?Description: Including pain rating scale, medication(s)/side effects and non-pharmacologic comfort measures ?Outcome: Progressing ?  ?Problem: Health Behavior/Discharge Planning: ?Goal: Ability to manage health-related needs will improve ?Outcome: Progressing ?  ?Problem: Clinical Measurements: ?Goal: Ability to maintain clinical measurements within normal limits will improve ?Outcome: Progressing ?Goal: Will remain free from infection ?Outcome: Progressing ?Goal: Diagnostic test results will improve ?Outcome: Progressing ?Goal: Respiratory complications will improve ?Outcome: Progressing ?Goal: Cardiovascular complication will be avoided ?Outcome: Progressing ?  ?Problem: Activity: ?Goal: Risk for activity intolerance will decrease ?Outcome: Progressing ?  ?Problem: Nutrition: ?Goal: Adequate nutrition will be maintained ?Outcome: Progressing ?  ?Problem: Coping: ?Goal: Level of anxiety will decrease ?Outcome: Progressing ?  ?Problem: Elimination: ?Goal: Will not experience complications related to bowel motility ?Outcome: Progressing ?Goal: Will not experience complications related to urinary retention ?Outcome: Progressing ?  ?Problem: Pain Managment: ?Goal: General experience of comfort will improve ?Outcome: Progressing ?  ?Problem: Safety: ?Goal: Ability to remain free from injury will improve ?Outcome: Progressing ?  ?Problem: Skin Integrity: ?Goal: Risk for impaired skin integrity will decrease ?Outcome: Progressing ?  ?Problem: Education: ?Goal: Knowledge of disease or condition will improve ?Outcome: Progressing ?Goal: Knowledge of the prescribed therapeutic regimen will improve ?Outcome: Progressing ?  ?Problem: Activity: ?Goal: Risk for activity intolerance will decrease ?Outcome: Progressing ?  ?Problem: Cardiac: ?Goal: Will achieve and/or maintain hemodynamic stability ?Outcome: Progressing ?  ?Problem: Clinical  Measurements: ?Goal: Postoperative complications will be avoided or minimized ?Outcome: Progressing ?  ?Problem: Respiratory: ?Goal: Respiratory status will improve ?Outcome: Progressing ?  ?Problem: Pain Management: ?Goal: Pain level will decrease ?Outcome: Progressing ?  ?Problem: Skin Integrity: ?Goal: Wound healing without signs and symptoms infection will improve ?Outcome: Progressing ?  ?

## 2021-07-25 NOTE — Op Note (Signed)
NAME: Sean Massey, COUSE ?MEDICAL RECORD NO: [id number] ?ACCOUNT NO: [id number] ?DATE OF BIRTH: 07-Dec-1945 ?FACILITY: [facility] ?LOCATION: [facility] ?PHYSICIAN: Revonda Standard. [name], MD ? ?Operative Report  ? ?DATE OF PROCEDURE: 07/24/2021 ? 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 ? 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 ? ?PROCEDURE:   ?Xi robotic-assisted left upper lobectomy, ?Lymph node dissection,  ?Intercostal nerve blocks levels 3 through 10. ? ?SURGEON:  Revonda Standard. [name], MD ? ?ASSISTANT:  Lars Pinks, PA. ? ?ANESTHESIA:  General. ? ?FINDINGS:  Frozen section of nodule revealed non-small cell carcinoma. ? ?CLINICAL NOTE:  Sean Massey is a 76 year old man with a remote history of smoking prior to quitting 20 years ago.  He had a CT of the neck which showed a left upper lobe lung nodule.  On PET/CT, the nodule was hypermetabolic with no evidence of regional ? or distant metastatic disease.  Findings were consistent with a stage IA or B non-small cell carcinoma.  He was advised to undergo surgical resection.  Plan would be to do a wedge resection to confirm diagnosis, followed by lobectomy or segmentectomy  ?depending on intraoperative findings.  The indications, risks, benefits, and alternatives were discussed in detail with the patient.  He understood and accepted the risks and agreed to proceed. ? ?OPERATIVE NOTE:  Sean Massey was brought to the preoperative holding area on 07/24/2021.  Anesthesia placed an arterial blood pressure monitoring line and established intravenous access.  He was taken to the operating room and anesthetized and intubated  ?with a double lumen endotracheal tube.  Intravenous antibiotics were administered.  A Foley catheter was placed.  Sequential compression devices were placed on the calves for DVT prophylaxis.  He was placed in a right lateral decubitus  position.  A Bair  ?Hugger was placed for active warming.  The left chest was prepped and draped in the usual sterile fashion. ? ?A timeout was performed.  A solution containing 20 mL of liposomal bupivacaine, 30 mL of 0.5% bupivacaine and 50 mL of saline was prepared.  This solution was used for local at the incision sites as well as for the intercostal nerve blocks.  An incision was made in  ?the eighth interspace in approximately the midaxillary line and an 8 mm port was inserted.  The thoracoscope was advanced into the chest. After confirming intrapleural placement, carbon dioxide was insufflated per protocol.  A 12 mm port was placed 5 cm  ?anterior to the camera port in the eighth interspace and then a 12 mm AirSeal port was placed in the tenth interspace anteriorly.  Intercostal nerve blocks were performed from the third to the tenth interspace by injecting 10 mL of the bupivacaine  ?solution into a subpleural plane at each level.  Two additional eighth interspace ports were placed for the robotic arms. The robot was deployed.  The camera arm was docked. Targeting was performed.  The remaining arms were docked.  Robotic instruments  ?were inserted with thoracoscopic visualization. ? ?The fissure was oriented relatively vertically.  The mass was visible in the upper lobe adjacent to the fissure.  A wedge resection was performed with sequential firings of the robotic stapler.  Specimen was placed into a 12 mm endoscopic retrieval bag,  ?removed, and sent for frozen section.  While awaiting the results of the frozen section, the lymph node dissection was performed. First,  there were adhesions of the lower  lobe to the diaphragm.  These were taken down with bipolar cautery.  The inferior  ?ligament then was divided.  All lymph nodes that were encountered were removed and sent as separate specimens for permanent pathology.  The pleural reflection was divided at the hilum posteriorly and a level 7 node was removed.   The pleural attachments  ?were taken off the pulmonary artery.  Next, attention was turned to the fissure.  It was nearly complete posteriorly and incomplete anteriorly.  Pulmonary artery was clearly identifiable in the mid portion of the fissure.  The pleura overlying it was  ?divided with bipolar cautery.  Lymph nodes were removed. By this point, the frozen section returned showing a non-small cell carcinoma.  The fissure was completed posteriorly with a single firing of the robotic stapler.  Multiple firings were required to ? divide the fissure anteriorly.  There were multiple level 11 lymph nodes, two of which were removed at this point and two of which were removed later prior to dividing the lingular artery branches and the left upper lobe bronchus.  The pleural  ?reflection was divided anteriorly.  The aortopulmonary window was opened and a level 5 node was removed.  Level 10 nodes were also removed from the hilum superiorly.  The lingular arterial branch was identified.  Given the size of the tumor and possible  ?visceral pleural involvement, it was felt that a lobectomy would be the best operation. The lingular arterial branches were encircled and divided with the robotic stapler and then the posterior upper lobe branch was encircled and divided. The anterior  ?and apical branches were very large vessels.  They were not divided at this time.  The superior pulmonary vein was dissected out, encircled and divided with the robotic stapler.  It was felt that the best option at this point was to divide the upper lobe ? bronchus.  The stapler was placed across the upper lobe bronchus and closed.  A test inflation showed good aeration of the lower lobe.  The stapler was fired, transecting the bronchus.  It then was used to divide the apical and anterior pulmonary  ?arterial branches. ? ?The 12 mm endoscopic retrieval bag was reinserted.  The upper lobe was placed into the bag.  It was brought down to the lower  portion of the chest.  The robotic instruments were removed.  The anterior eighth interspace incision was lengthened to 3 cm.   ?The specimen was removed through that incision and sent for permanent pathology.  Inspection was made and there was good hemostasis.  The chest was copiously irrigated with warm saline.  A test inflation to 30 cm of water revealed no leakage from the  ?bronchial stump.  There was a small leak from the fissure.  A 28-French Blake drain was placed through the original port incision and secured with #1 silk suture.  Dual lung ventilation was resumed.  The remaining incisions were closed in standard  ?fashion. The patient was placed in supine position.  The chest tube was placed to a Pleur-Evac on suction.  The patient was extubated in the operating room and taken to the postanesthetic care unit in good condition.  All sponge, needle and instrument  ?counts were correct at the end of the procedure. ? ?Experienced assistance was necessary for this case due to complexity and surgical standard of care.  Lars Pinks assisted with port placement, suctioning, specimen retrieval, instrument exchange and wound closure. ? ? ?VAI ?D: 07/24/2021 4:18:31 pm  T: 07/25/2021 1:05:00 am  ?JOB: [id number]/ [id number]  ?

## 2021-07-25 NOTE — Progress Notes (Signed)
SATURATION QUALIFICATIONS: (This note is used to comply with regulatory documentation for home oxygen) ? ?Patient Saturations on Room Air at Rest = 96% ? ?Patient Saturations on Room Air while Ambulating = 90% ? ?Patient Saturations on 0 Liters of oxygen while Ambulating = n/a% ? ?Please briefly explain why patient needs home oxygen: ?

## 2021-07-25 NOTE — Progress Notes (Signed)
?  Mobility Specialist Criteria Algorithm Info. ? ? ? 07/25/21 1430  ?Pain Assessment  ?Pain Assessment No/denies pain  ?Oxygen Therapy  ?SpO2 90 %  ?O2 Device Room Air  ?Patient Activity (if Appropriate) Ambulating  ?Pulse Oximetry Type Continuous  ?Mobility  ?Activity Ambulated with assistance in hallway  ?Range of Motion/Exercises Active;All extremities  ?Level of Assistance Standby assist, set-up cues, supervision of patient - no hands on  ?Assistive Device Front wheel walker  ?Distance Ambulated (ft) 500 ft  ?Activity Response Tolerated well  ? ?Patient received in supine eager to participate in mobility. Was independent with bed mobility from supine>sit. Upon dangling EOB pt coughed a few times then his chest tube started leaking. RN notified. Ambulated in hallway with steady gait. Denied any SOB and SpO2 maintained >90% on RA while ambulating. Returned to room without complaint or incident. Was left dangling EOB with all needs met, call bell in reach.   ? ?07/25/2021 ?4:53 PM ? ?Martinique Elgar Scoggins, CMS, BS EXP ?Acute Rehabilitation Services  ?RKSYS:[phone] ?Office: [phone] ? ?

## 2021-07-25 NOTE — Progress Notes (Signed)
?   ?  [address] ?      [place] [address] ?            [phone]      ? ?No events ? ?Vitals:  ? 07/25/21 0741 07/25/21 0824  ?BP: (!) 144/98   ?Pulse:  88  ?Resp: 19 16  ?Temp: 98.7 ?F (37.1 ?C)   ?SpO2:  94%  ? ?Alert NAD ?Sinus EWOB ?Small leak on suction.  No leak off suction with cough ?CT secured at 5cm ? ?POD 1 s/p L RATS, LULectomy ?CT to WS ?Will recheck CXR ?Ambulation and IS ? ?Sean Massey ? ?

## 2021-07-25 NOTE — Progress Notes (Signed)
Night shift reported to RN incident of blood drainage under chest tube on the floor two times during the shift. RN assessed pt at shift change to see blood dripping from tube connection and constant bubbling in chamber.  ?PT not in respiratory distress with O2 at 94% and respirations at 19.  ?MD notified  ?Chest tube drainage chamber changed by RN ? ?

## 2021-07-26 ENCOUNTER — Inpatient Hospital Stay (HOSPITAL_COMMUNITY): Payer: Medicare PPO

## 2021-07-26 LAB — CBC
HCT: 27 % — ABNORMAL LOW (ref 39.0–52.0)
Hemoglobin: 8.5 g/dL — ABNORMAL LOW (ref 13.0–17.0)
MCH: 30 pg (ref 26.0–34.0)
MCHC: 31.5 g/dL (ref 30.0–36.0)
MCV: 95.4 fL (ref 80.0–100.0)
Platelets: 252 10*3/uL (ref 150–400)
RBC: 2.83 MIL/uL — ABNORMAL LOW (ref 4.22–5.81)
RDW: 17.7 % — ABNORMAL HIGH (ref 11.5–15.5)
WBC: 7 10*3/uL (ref 4.0–10.5)
nRBC: 0 % (ref 0.0–0.2)

## 2021-07-26 LAB — COMPREHENSIVE METABOLIC PANEL
ALT: 16 U/L (ref 0–44)
AST: 29 U/L (ref 15–41)
Albumin: 3.3 g/dL — ABNORMAL LOW (ref 3.5–5.0)
Alkaline Phosphatase: 42 U/L (ref 38–126)
Anion gap: 5 (ref 5–15)
BUN: 14 mg/dL (ref 8–23)
CO2: 26 mmol/L (ref 22–32)
Calcium: 9.3 mg/dL (ref 8.9–10.3)
Chloride: 103 mmol/L (ref 98–111)
Creatinine, Ser: 1.27 mg/dL — ABNORMAL HIGH (ref 0.61–1.24)
GFR, Estimated: 59 mL/min — ABNORMAL LOW (ref >60)
Glucose, Bld: 112 mg/dL — ABNORMAL HIGH (ref 70–99)
Potassium: 3.8 mmol/L (ref 3.5–5.1)
Sodium: 134 mmol/L — ABNORMAL LOW (ref 135–145)
Total Bilirubin: 0.8 mg/dL (ref 0.3–1.2)
Total Protein: 6.3 g/dL — ABNORMAL LOW (ref 6.5–8.1)

## 2021-07-26 IMAGING — DX DG CHEST 1V SAME DAY
1 series · 1 of 1 positions shown · non-contrast
Comparison: Radiograph earlier today.

CLINICAL DATA: Chest tube removal.

EXAM:
CHEST - 1 VIEW SAME DAY

[chest]
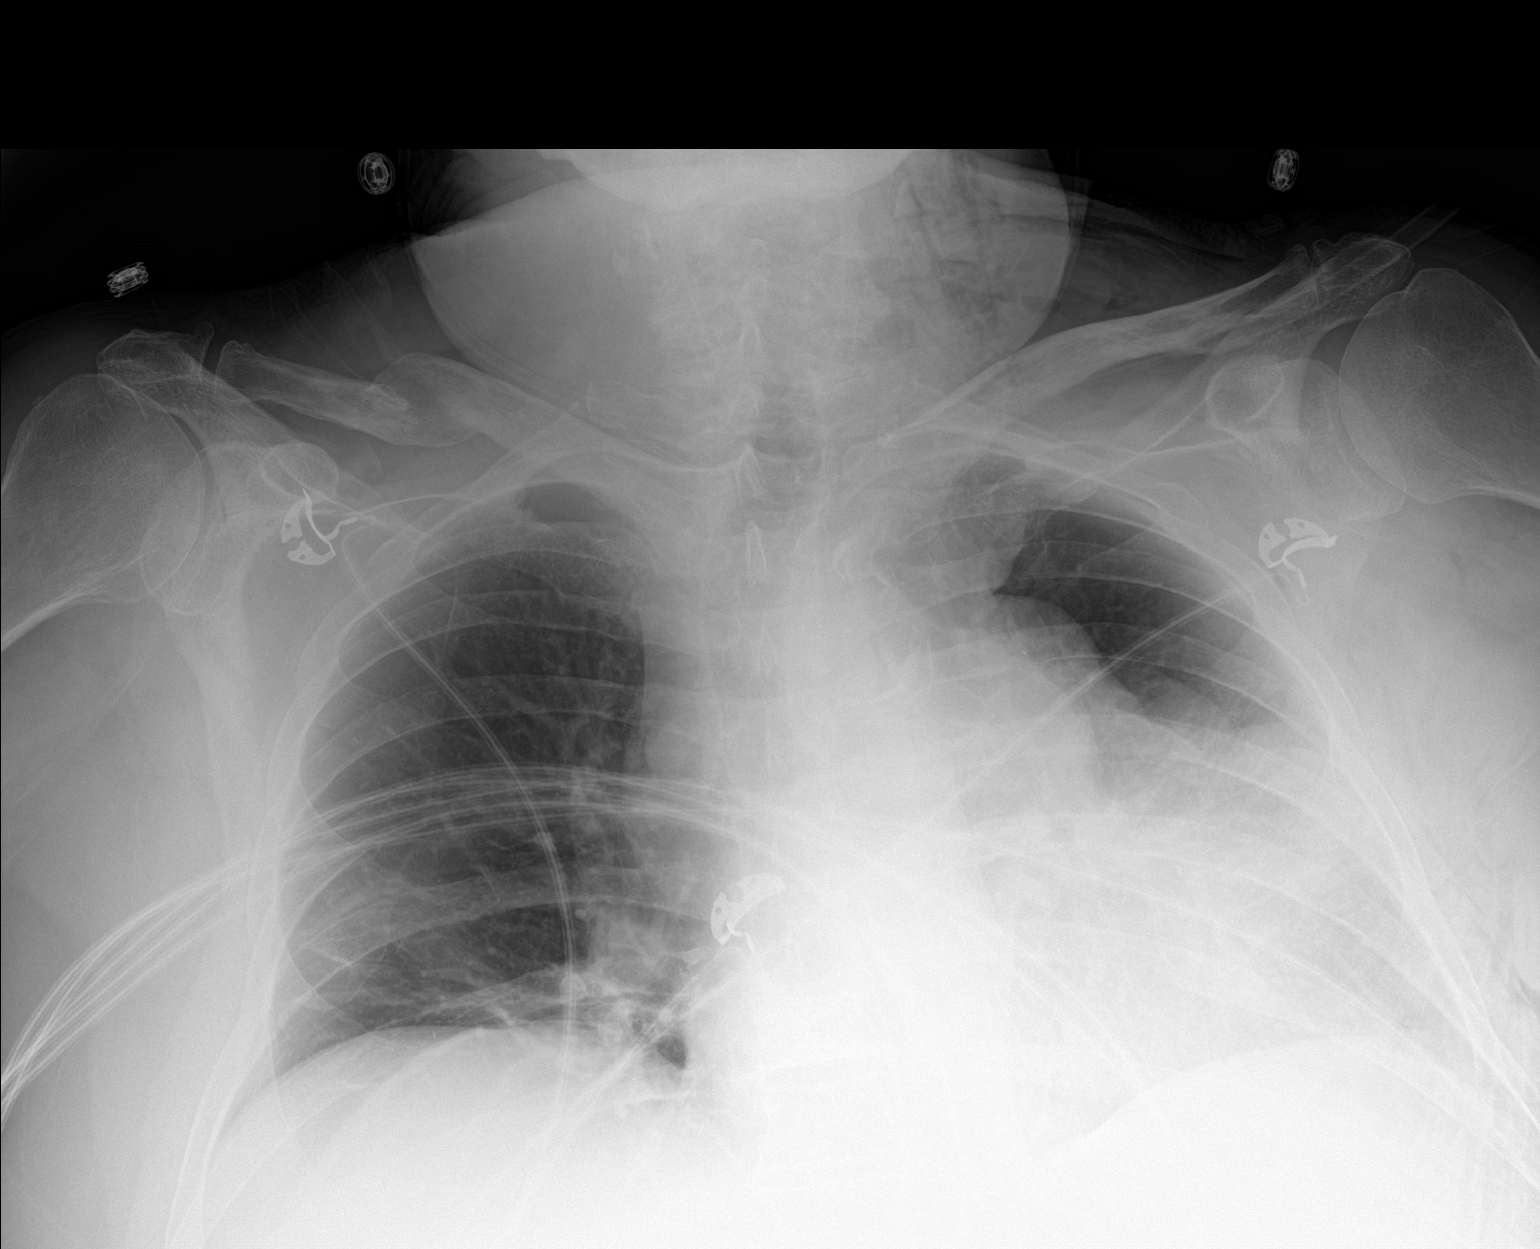

[1 of 1 positions shown; findings below may reference images not displayed]

FINDINGS: The left-sided chest tube has been removed. There is no visible
pneumothorax. Persistent subcutaneous emphysema in the left chest
wall and supraclavicular soft tissues. Low lung volumes persist.
Stable heart size and mediastinal contours. Streaky right lung base
atelectasis. Remote right clavicle fracture.
IMPRESSION: 1. Removal of left-sided chest tube. No visible pneumothorax. Stable
subcutaneous emphysema in the left chest wall and supraclavicular
soft tissues.
2. Low lung volumes with right lung base atelectasis.

## 2021-07-26 IMAGING — DX DG CHEST 1V PORT
1 series · 1 of 1 positions shown · non-contrast
Comparison: [DATE]

CLINICAL DATA: Pneumothorax

EXAM:
PORTABLE CHEST 1 VIEW

[chest]
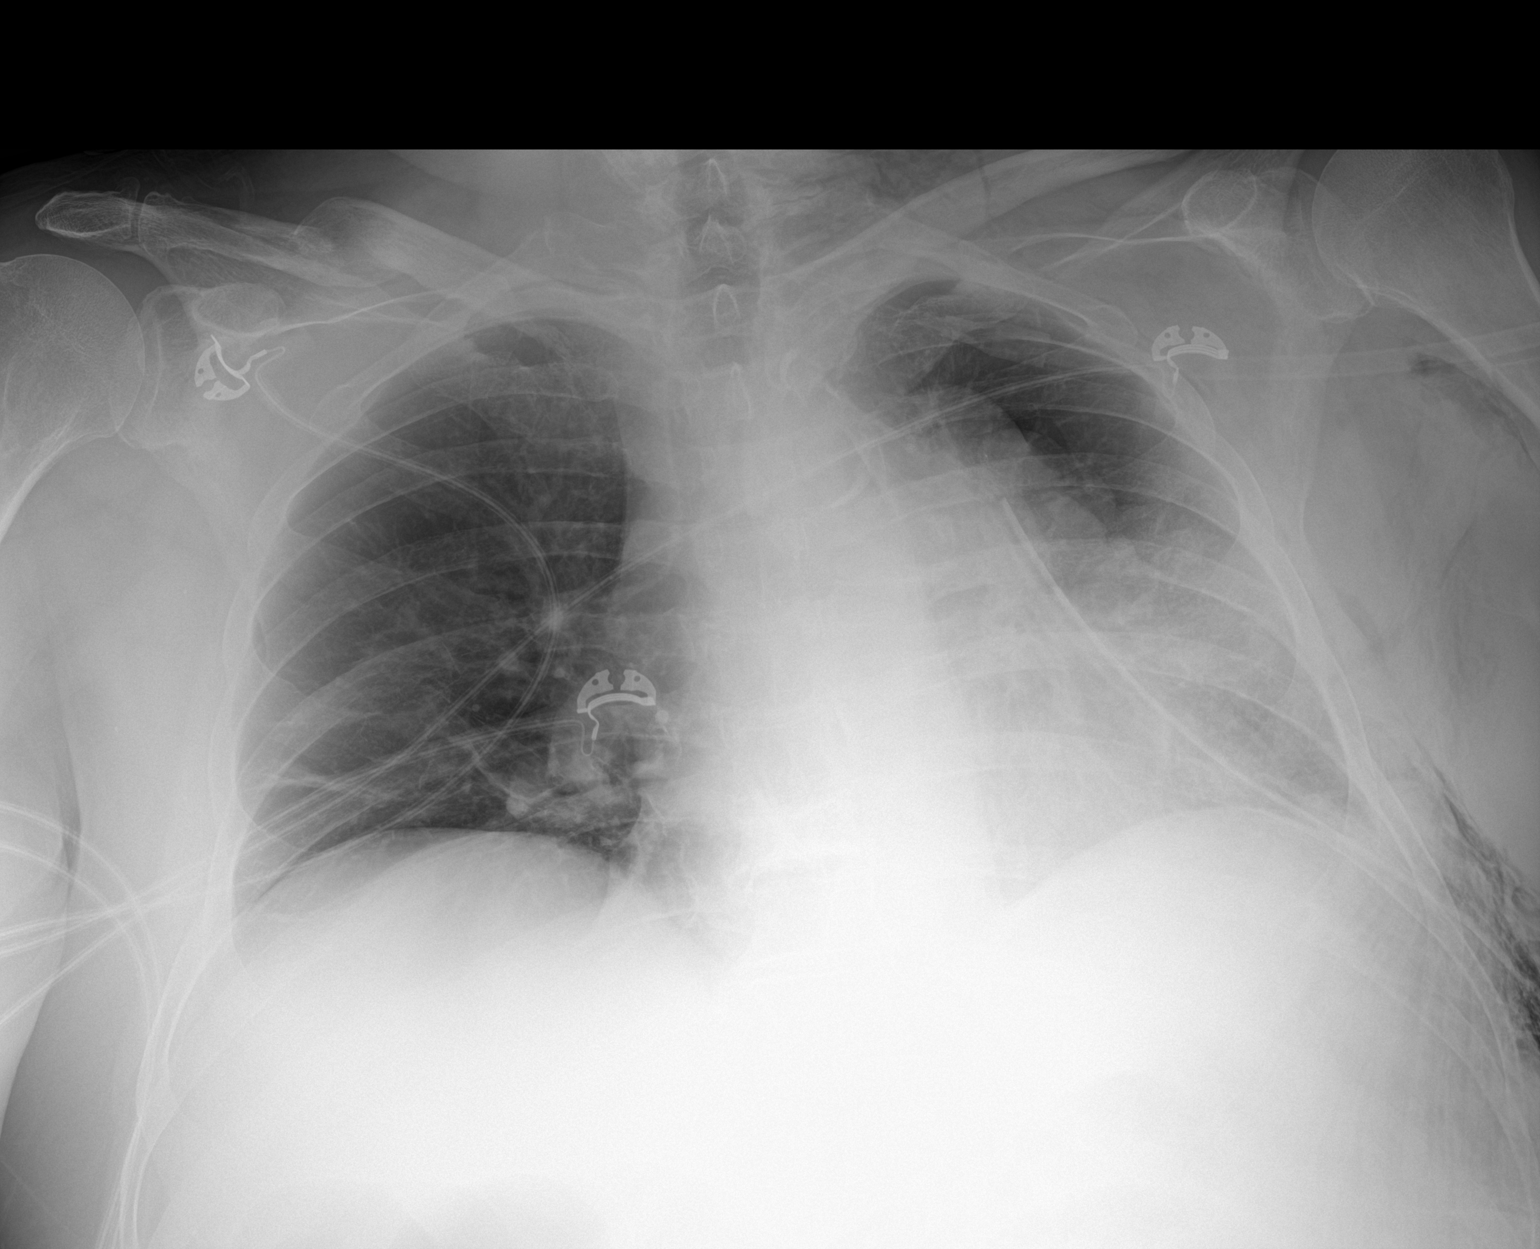

[1 of 1 positions shown; findings below may reference images not displayed]

FINDINGS: Shallow inspiration with low lung volumes. Left chest tube has
retracted with new or increased subcutaneous emphysema. No definite
pneumothorax. Patchy bibasilar atelectasis. Stable cardiomediastinal
contours
IMPRESSION: Retraction of chest tube with new or increased subcutaneous
emphysema. No definite pneumothorax.

Patchy bibasilar atelectasis.

## 2021-07-26 MED ORDER — IRBESARTAN 150 MG PO TABS
300.0000 mg | ORAL_TABLET | Freq: Every day | ORAL | Status: DC
  Administered 2021-07-26 – 2021-07-27 (×2): 300 mg via ORAL
  Filled 2021-07-26 (×2): qty 2

## 2021-07-26 NOTE — Progress Notes (Signed)
?  Mobility Specialist Criteria Algorithm Info. ? ? 07/26/21 1518  ?Pain Assessment  ?Pain Assessment 0-10  ?Pain Score 2  ?Pain Location Surgical site  ?Pain Descriptors / Indicators Sore  ?Pain Intervention(s) Limited activity within patient's tolerance  ?Oxygen Therapy  ?SpO2 90 %  ?O2 Device Nasal Cannula  ?Mobility  ?Activity Ambulated with assistance in hallway  ?Range of Motion/Exercises Active;All extremities  ?Level of Assistance Standby assist, set-up cues, supervision of patient - no hands on  ?Assistive Device Front wheel walker  ?Distance Ambulated (ft) 500 ft  ?Activity Response Tolerated well  ? ?Patient received in bed asleep and easily aroused. Ambulated in hallway supervision level with steady gait. Oxygen saturated >90% throughout, but had occasional drops to 88% requiring cues for pursed lip breathing. Returned to room without complaint or incident. Was left dangling EOB with all needs met, call bell in reach. ? ?07/26/2021 ?3:19 PM ? ?Sean Massey, CMS, BS EXP ?Acute Rehabilitation Services  ?PRFFM:[phone] ?Office: [phone] ? ?

## 2021-07-26 NOTE — Plan of Care (Signed)
?  Problem: Education: ?Goal: Knowledge of General Education information will improve ?Description: Including pain rating scale, medication(s)/side effects and non-pharmacologic comfort measures ?Outcome: Progressing ?  ?Problem: Health Behavior/Discharge Planning: ?Goal: Ability to manage health-related needs will improve ?Outcome: Progressing ?  ?Problem: Clinical Measurements: ?Goal: Ability to maintain clinical measurements within normal limits will improve ?Outcome: Progressing ?Goal: Will remain free from infection ?Outcome: Progressing ?Goal: Diagnostic test results will improve ?Outcome: Progressing ?Goal: Respiratory complications will improve ?Outcome: Progressing ?Goal: Cardiovascular complication will be avoided ?Outcome: Progressing ?  ?Problem: Activity: ?Goal: Risk for activity intolerance will decrease ?Outcome: Progressing ?  ?Problem: Nutrition: ?Goal: Adequate nutrition will be maintained ?Outcome: Progressing ?  ?Problem: Coping: ?Goal: Level of anxiety will decrease ?Outcome: Progressing ?  ?Problem: Elimination: ?Goal: Will not experience complications related to bowel motility ?Outcome: Progressing ?Goal: Will not experience complications related to urinary retention ?Outcome: Progressing ?  ?Problem: Pain Managment: ?Goal: General experience of comfort will improve ?Outcome: Progressing ?  ?Problem: Safety: ?Goal: Ability to remain free from injury will improve ?Outcome: Progressing ?  ?Problem: Skin Integrity: ?Goal: Risk for impaired skin integrity will decrease ?Outcome: Progressing ?  ?Problem: Education: ?Goal: Knowledge of disease or condition will improve ?Outcome: Progressing ?Goal: Knowledge of the prescribed therapeutic regimen will improve ?Outcome: Progressing ?  ?Problem: Activity: ?Goal: Risk for activity intolerance will decrease ?Outcome: Progressing ?  ?Problem: Cardiac: ?Goal: Will achieve and/or maintain hemodynamic stability ?Outcome: Progressing ?  ?Problem: Clinical  Measurements: ?Goal: Postoperative complications will be avoided or minimized ?Outcome: Progressing ?  ?Problem: Respiratory: ?Goal: Respiratory status will improve ?Outcome: Progressing ?  ?Problem: Pain Management: ?Goal: Pain level will decrease ?Outcome: Progressing ?  ?Problem: Skin Integrity: ?Goal: Wound healing without signs and symptoms infection will improve ?Outcome: Progressing ?  ?

## 2021-07-26 NOTE — Progress Notes (Addendum)
? ?   ?  [address] ?      [place] [address] ?            [phone]   ? ?  ?2 Days Post-Op Procedure(s) (LRB): ?XI ROBOTIC ASSISTED THORACOSCOPY-WEDGE RESECTION AND UPPER LEFT LUNG LOBECTOMY (Left) ?NODE DISSECTION (Left) ?INTERCOSTAL NERVE BLOCK (Left) ? ?Subjective: ? ?Contacted via nursing yesterday in regards to drainage from chest tube tubing and around chest tube.  This morning patient is w/o complaints.   ? ?Objective: ?Vital signs in last 24 hours: ?Temp:  [97.6 ?F (36.4 ?C)-98.4 ?F (36.9 ?C)] 98.1 ?F (36.7 ?C) (04/30 0430) ?Pulse Rate:  [70-101] 70 (04/30 0430) ?Cardiac Rhythm: Normal sinus rhythm;Bundle branch block (04/30 0723) ?Resp:  [12-17] 12 (04/30 0430) ?BP: (120-144)/(77-96) 130/87 (04/30 0430) ?SpO2:  [90 %-99 %] 99 % (04/30 0430) ? ?Intake/Output from previous day: ?04/29 0701 - 04/30 0700 ?In: -  ?Out: 110 [Chest Tube:110] ? ?General appearance: alert, cooperative, and no distress ?Heart: regular rate and rhythm ?Lungs: clear to auscultation bilaterally ?Abdomen: soft, non-tender; bowel sounds normal; no masses,  no organomegaly ?Extremities: extremities normal, atraumatic, no cyanosis or edema ?Wound: clean, irritated skin, serous drainage present ? ?Lab Results: ?Recent Labs  ?  07/26/21 ?0356  ?WBC 7.0  ?HGB 8.5*  ?HCT 27.0*  ?PLT 252  ? ?BMET:  ?Recent Labs  ?  07/25/21 ?7619 07/26/21 ?0356  ?NA 135 134*  ?K 4.3 3.8  ?CL 103 103  ?CO2 22 26  ?GLUCOSE 163* 112*  ?BUN 16 14  ?CREATININE 1.40* 1.27*  ?CALCIUM 9.6 9.3  ?  ?PT/INR: No results for input(s): LABPROT, INR in the last 72 hours. ?ABG ?   ?Component Value Date/Time  ? PHART 7.43 07/21/2021 1428  ? HCO3 27.9 07/21/2021 1428  ? TCO2 28 05/04/2019 1040  ? O2SAT 98.4 07/21/2021 1428  ? ?CBG (last 3)  ?No results for input(s): GLUCAP in the last 72 hours. ? ?Assessment/Plan: ?S/P Procedure(s) (LRB): ?XI ROBOTIC ASSISTED THORACOSCOPY-WEDGE RESECTION AND UPPER LEFT LUNG LOBECTOMY (Left) ?NODE DISSECTION  (Left) ?INTERCOSTAL NERVE BLOCK (Left) ? ?CV- NSR, H/O HTN- continue Verapamil and will increase Avapro to home regimen ?Pulm- CT on water seal, no air leak present, large amount of serous drainage around chest tube site... I have placed bag to catch fluids instead of constant dressing changes due to patient's skin irritation and overall discomfort.  CXR w/o pneumothorax, there is increased atelectasis, tube appears to have pulled back some... will discuss management with Dr. Kipp Brood ?Renal-creatinine improved down to 1.27, K is WNL ?Expected post operative blood loss anemia, Hgb at 8.5 ?Lovenox for DVT prophylaxis ?Dispo- patient stable, a large amount of serous drainage from around chest tube, I have adjusted dressing to hopefully avoid further skin issues, no air leak.. possibly d/c chest tube today vs. Tomorrow, will discuss with Dr. Kipp Brood. ? ? LOS: 2 days  ? ? ?Ellwood Handler, PA-C ?07/26/2021 ? ? ?Agree with above ?Will remove CT today ?IS, ambulation ? ?Lucile Crater Raffaele Derise ? ? ?

## 2021-07-27 ENCOUNTER — Inpatient Hospital Stay (HOSPITAL_COMMUNITY): Payer: Medicare PPO

## 2021-07-27 ENCOUNTER — Other Ambulatory Visit (HOSPITAL_COMMUNITY): Payer: Self-pay

## 2021-07-27 LAB — SURGICAL PATHOLOGY

## 2021-07-27 IMAGING — CR DG CHEST 2V
3 series · 3 of 3 positions shown · non-contrast
Comparison: AP chest [DATE] (multiple studies; AP chest
[DATE]

CLINICAL DATA: Chest tube removal

EXAM:
CHEST - 2 VIEW

[chest lat (1 of 2)]
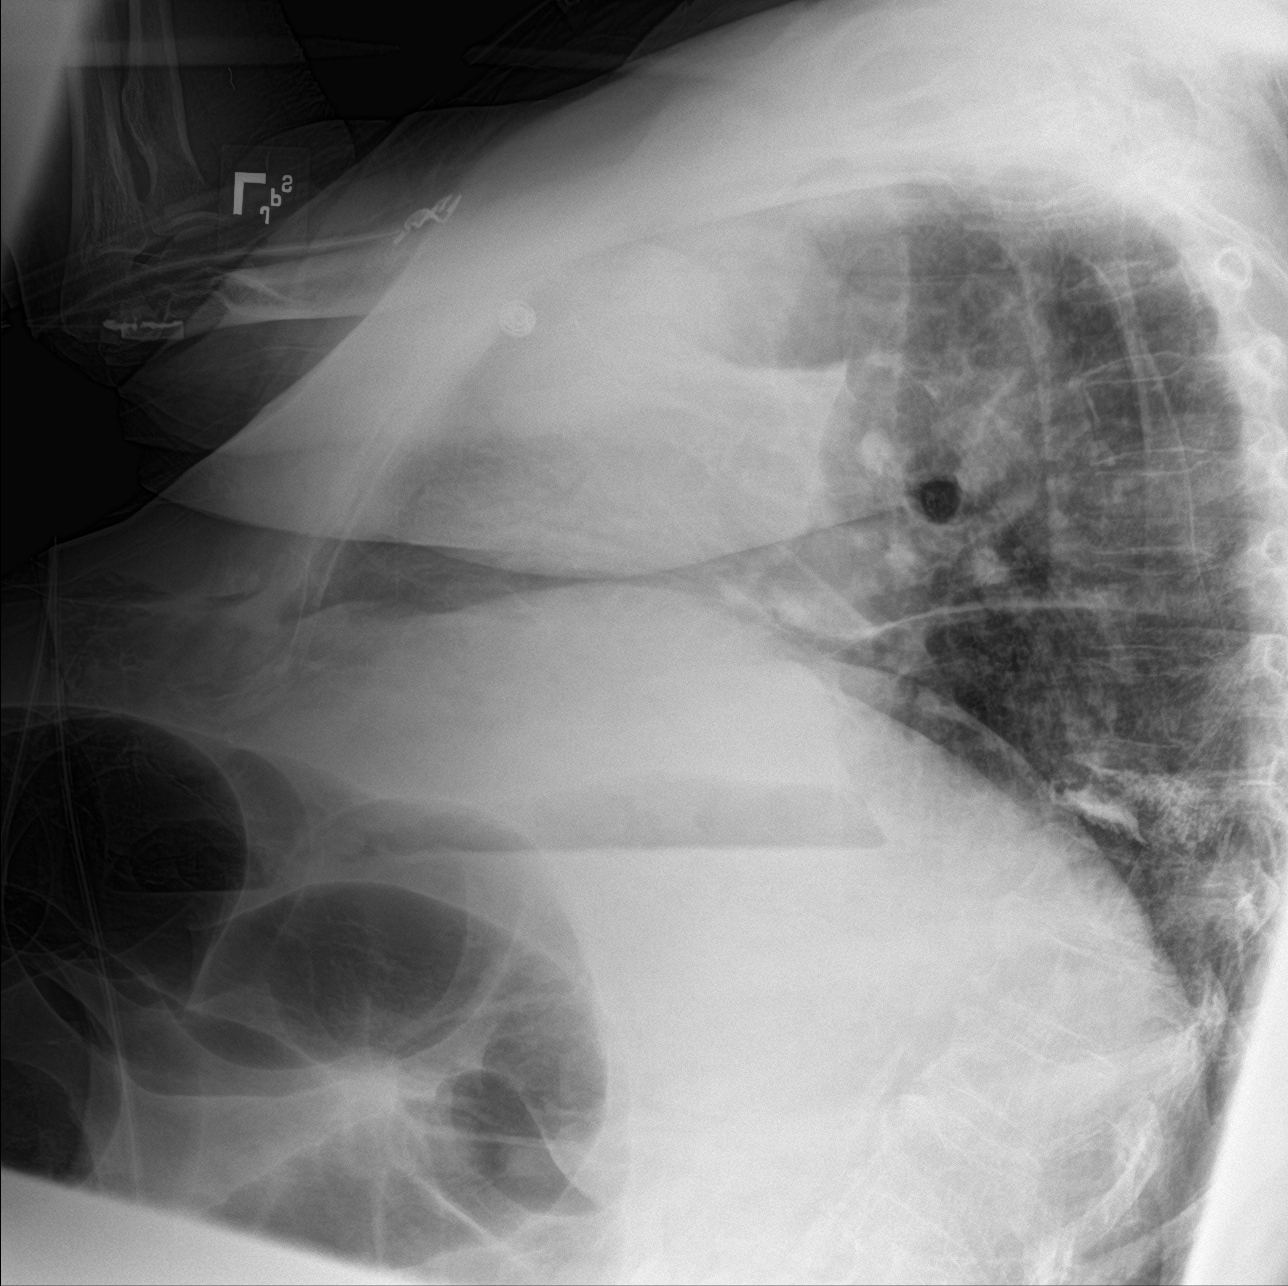

[chest ap]
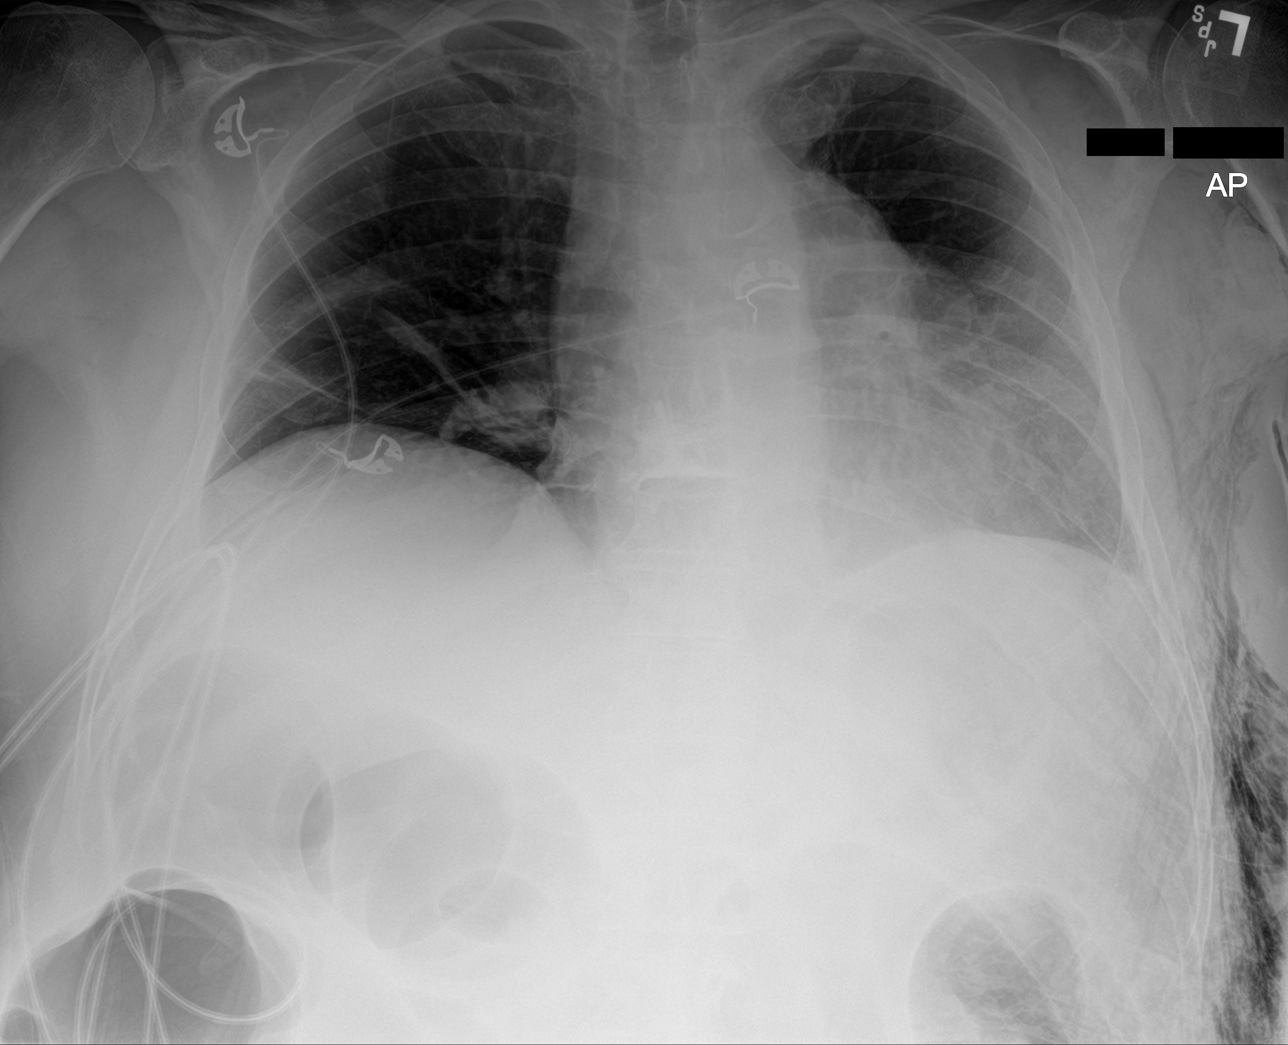

[chest lat (2 of 2)]
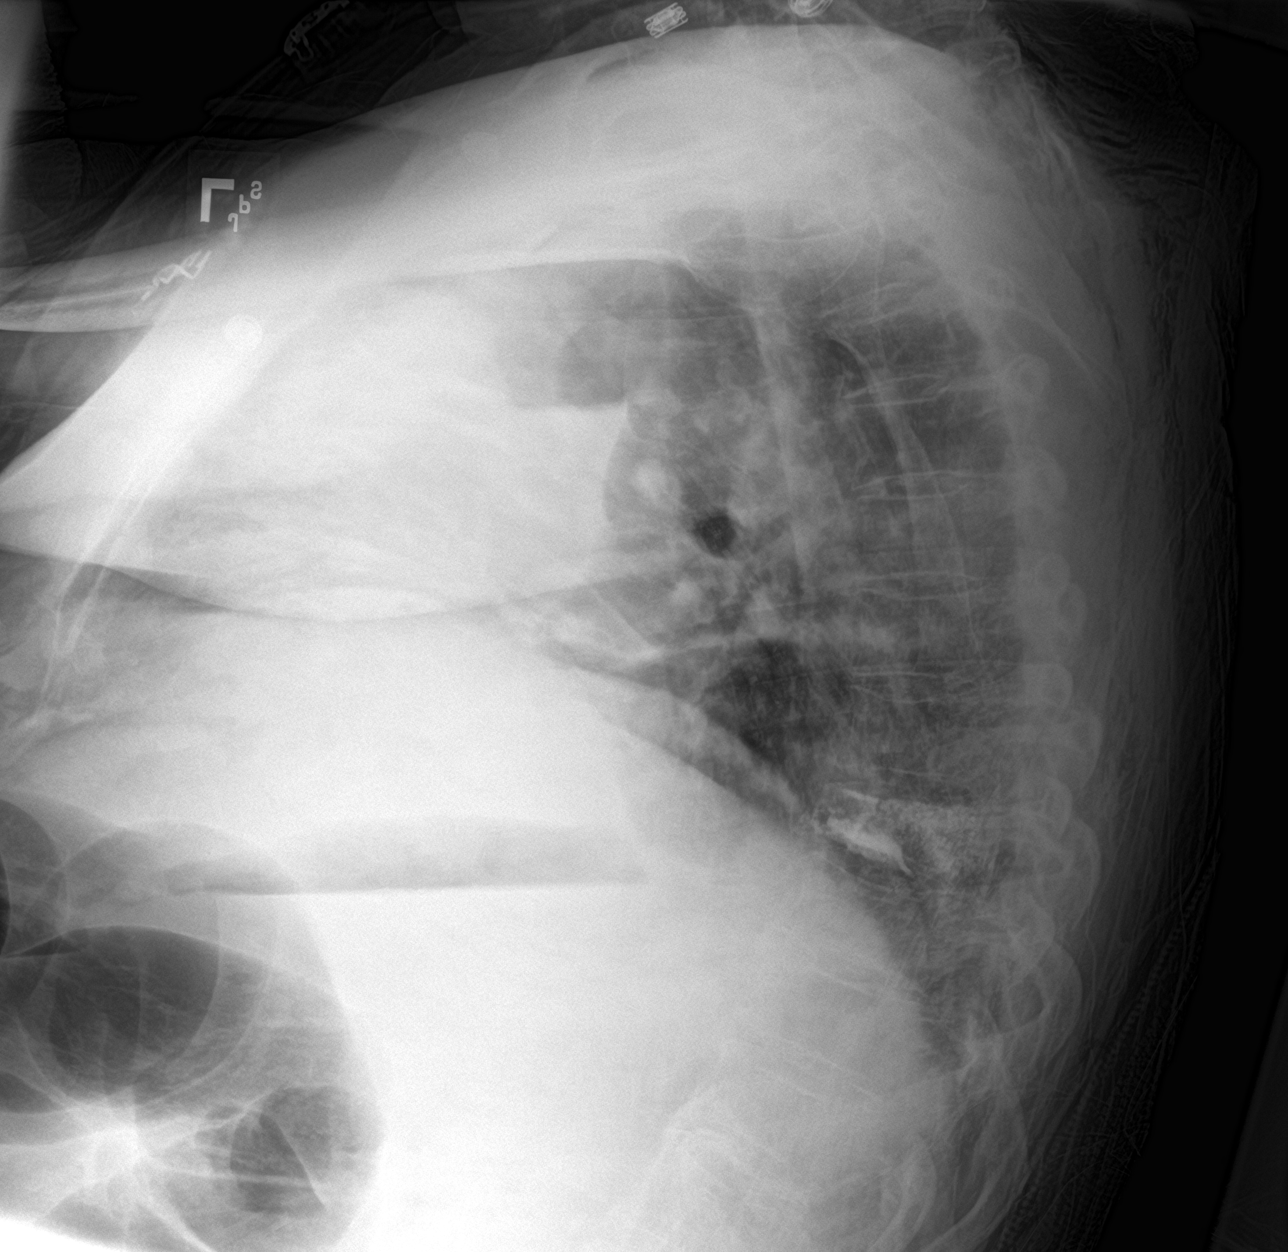

[3 of 3 positions shown; findings below may reference images not displayed]

FINDINGS: Cardiac silhouette is again moderately enlarged. Mediastinal
contours are within normal limits. Moderately decreased lung volumes
are again seen. A left chest tube seen on the earlier of to
[DATE] radiographs is again no longer visualized. There is again
subcutaneous emphysema within the mid and inferior left hemithorax
and left flank soft tissues. There is again streaky linear
subsegmental atelectasis within the bilateral lung bases. Remote
right clavicular fracture is partially visualized. No pneumothorax
is seen. No pleural effusion. Vertebral body augmentation cement is
again seen within the lower thoracic spine.
IMPRESSION: Again no pneumothorax is seen following removal of right chest tube
on [DATE]. Left chest wall subcutaneous emphysema is similar to
prior.

Unchanged low lung volumes with subsegmental atelectasis.

## 2021-07-27 MED ORDER — OXYCODONE HCL 5 MG PO TABS
5.0000 mg | ORAL_TABLET | Freq: Four times a day (QID) | ORAL | 0 refills | Status: AC | PRN
  Filled 2021-07-27: qty 30, 8d supply, fill #0

## 2021-07-27 NOTE — Progress Notes (Signed)
?  Mobility Specialist Criteria Algorithm Info. ? ? ? 07/27/21 1000  ?Oxygen Therapy  ?SpO2 90 %  ?O2 Device Nasal Cannula  ?Patient Activity (if Appropriate) Ambulating  ?Pulse Oximetry Type Continuous  ?Mobility  ?Activity Ambulated with assistance in hallway  ?Range of Motion/Exercises Active;All extremities  ?Level of Assistance Standby assist, set-up cues, supervision of patient - no hands on  ?Assistive Device Front wheel walker  ?Distance Ambulated (ft) 500 ft  ?Activity Response Tolerated well  ? ?Patient received in supine eager to participate in mobility. Ambulated in hallway supervision level with steady gait. Oxygen saturations fluctuated 87-93% during ambulation, required 3LO2 to maintain >90%. Also needed mod cues for proper breathing technique through nose. Returned to room without complaint or incident. Was left dangling EOB with all needs met, call bell in reach.  ? ?07/27/2021 ?10:51 AM ? ?Sean Massey, CMS, BS EXP ?Acute Rehabilitation Services  ?[phone] ?Office: [phone] ? ?

## 2021-07-27 NOTE — TOC Progression Note (Signed)
Transition of Care (TOC) - Progression Note  ? ? ?Patient Details  ?Name: Sean Massey ?MRN: [id number] ?Date of Birth: [birth year] ? ?Transition of Care (TOC) CM/SW Contact  ?Angelita Ingles, RN ?Phone Number:[phone] ? ?07/27/2021, 9:09 AM ? ?Clinical Narrative:    ?TOC following patient with orders for Home O2. There is not a qualifying note documented. Patient will need qualifying note with sats documented at 88% or below in order to qualify for home O2. CM can not order O2 at this time. Message has been sent to primary RN.  ? ? ?  ?  ? ?Expected Discharge Plan and Services ?  ?  ?  ?  ?  ?                ?  ?  ?  ?  ?  ?  ?  ?  ?  ?  ? ? ?Social Determinants of Health (SDOH) Interventions ?  ? ?Readmission Risk Interventions ?   ? View : No data to display.  ?  ?  ?  ? ? ?

## 2021-07-27 NOTE — Care Management Important Message (Signed)
Important Message ? ?Patient Details  ?Name: Sean Massey ?MRN: [id number] ?Date of Birth: 03/22/1946 ? ? ?Medicare Important Message Given:  Yes ? ? ? ? ?Wileen Duncanson ?07/27/2021, 3:53 PM ?

## 2021-07-27 NOTE — Progress Notes (Signed)
SATURATION QUALIFICATIONS: (This note is used to comply with regulatory documentation for home oxygen) ? ?Patient Saturations on Room Air at Rest = 86% ? ?Patient Saturations on Room Air while Ambulating = n/a% ? ?Patient Saturations on 3 Liters of oxygen while Ambulating = 92% ? ?Please briefly explain why patient needs home oxygen: ?

## 2021-07-27 NOTE — Progress Notes (Addendum)
? ?   ?  [address] ?      [place] [address] ?            [phone]   ? ?  ? ?3 Days Post-Op Procedure(s) (LRB): ?XI ROBOTIC ASSISTED THORACOSCOPY-WEDGE RESECTION AND UPPER LEFT LUNG LOBECTOMY (Left) ?NODE DISSECTION (Left) ?INTERCOSTAL NERVE BLOCK (Left) ? ?Subjective: ?Patient had nausea yesterday but none this am. Sean Massey is passing flatus. Sean Massey has abdominal discomfort/pain this am. ? ?Objective: ?Vital signs in last 24 hours: ?Temp:  [97.9 ?F (36.6 ?C)-98.2 ?F (36.8 ?C)] 98.1 ?F (36.7 ?C) (04/30 2356) ?Pulse Rate:  [73-87] 87 (04/30 2356) ?Cardiac Rhythm: Normal sinus rhythm;Bundle branch block (04/30 1900) ?Resp:  [14] 14 (04/30 2356) ?BP: (104-127)/(65-90) 104/65 (04/30 2356) ?SpO2:  [90 %-96 %] 90 % (04/30 2356) ? ?  ? ?Intake/Output from previous day: ?No intake/output data recorded. ? ? ?Physical Exam: ? ?Cardiovascular: RRR ?Pulmonary: Clear to auscultation on the right and slightly diminished left apex ?Abdomen: Soft, distended, some tenderness to palpation, occasional bowel sounds present. ?Extremities: Mild bilateral lower extremity edema. ?Wounds: Clean and dry.  No erythema or signs of infection. ? ? ?Lab Results: ?CBC: ?Recent Labs  ?  07/26/21 ?0356  ?WBC 7.0  ?HGB 8.5*  ?HCT 27.0*  ?PLT 252  ? ?BMET:  ?Recent Labs  ?  07/25/21 ?8588 07/26/21 ?0356  ?NA 135 134*  ?K 4.3 3.8  ?CL 103 103  ?CO2 22 26  ?GLUCOSE 163* 112*  ?BUN 16 14  ?CREATININE 1.40* 1.27*  ?CALCIUM 9.6 9.3  ?  ?PT/INR: No results for input(s): LABPROT, INR in the last 72 hours. ?ABG:  ?INR: ?Will add last result for INR, ABG once components are confirmed ?Will add last 4 CBG results once components are confirmed ? ?Assessment/Plan: ? ?1. CV - SR. On Verapamil SR 240 mg daily and Irbesartan 300 mg daily. ?2.  Pulmonary - On 3 liters of oxygen via [place]. Wean as able.  Per nurse, Sean Massey needs oxygen at night as does not desat with ambulation during the day. Per patient's wife, Sean Massey used to wear oxygen at night, ? OSA. CXR this  am appears stable (left lateral subcutaneous emphysema). Await final pathology for staging. Encourage incentive spirometer. ?3. Expected post op blood loss anemia-H and H yesterday decreased to 8.5 and 27. ?4. GI-history of colitis. On Tofacitinib citrate and Hyoscyamine PRN as taken prior to surgery. CXR appears to show some distended bowel-? Ileus vs colitis flare up. Will discuss management with Dr. Roxan Massey. ? Sean Ink ZimmermanPA-C ?07/27/2021,7:02 AM ?[phone]  ? ?Patient seen and examined, agree with above ?Will see how Sean Massey does with ambulation, PO intake this AM ?Possibly home this afternoon ? ?Sean Standard Sean Hockey, MD ?[facility] ?[phone] ? ?

## 2021-07-27 NOTE — TOC Progression Note (Addendum)
Transition of Care (TOC) - Progression Note  ? ? ?Patient Details  ?Name: Sean Massey ?MRN: [id number] ?Date of Birth: 02-27-1946 ? ?Transition of Care (TOC) CM/SW Contact  ?Angelita Ingles, RN ?Phone Number:[phone] ? ?07/27/2021, 2:38 PM ? ?Clinical Narrative:    ?Home Oxygen order called to [employer].Portable tank to be delivered to the room for discharge. Message sent to bedside nurse to make aware.  ? ? ?  ?  ? ?Expected Discharge Plan and Services ?  ?  ?  ?  ?  ?Expected Discharge Date: 07/27/21               ?  ?  ?  ?  ?  ?  ?  ?  ?  ?  ? ? ?Social Determinants of Health (SDOH) Interventions ?  ? ?Readmission Risk Interventions ?   ? View : No data to display.  ?  ?  ?  ? ? ?

## 2021-07-28 ENCOUNTER — Ambulatory Visit: Payer: Medicare PPO | Admitting: Urology

## 2021-08-06 ENCOUNTER — Ambulatory Visit: Payer: Medicare PPO | Admitting: Thoracic Surgery (Cardiothoracic Vascular Surgery)

## 2021-08-27 DEATH — deceased

## 2021-09-01 ENCOUNTER — Ambulatory Visit (HOSPITAL_COMMUNITY): Payer: Medicare PPO | Admitting: Hematology

## 2021-09-22 ENCOUNTER — Ambulatory Visit: Payer: Medicare PPO | Admitting: Urology
